# Patient Record
Sex: Male | Born: 1980 | Race: White | Hispanic: No | Marital: Married | Smoking: Never smoker
Health system: Southern US, Community
[De-identification: ages and names within clinical notes are randomized; demographics above are authoritative.]

## PROBLEM LIST (undated history)

## (undated) DIAGNOSIS — G4733 Obstructive sleep apnea (adult) (pediatric): Secondary | ICD-10-CM

## (undated) DIAGNOSIS — F319 Bipolar disorder, unspecified: Secondary | ICD-10-CM

## (undated) DIAGNOSIS — K8010 Calculus of gallbladder with chronic cholecystitis without obstruction: Secondary | ICD-10-CM

## (undated) DIAGNOSIS — E785 Hyperlipidemia, unspecified: Secondary | ICD-10-CM

## (undated) DIAGNOSIS — E039 Hypothyroidism, unspecified: Secondary | ICD-10-CM

## (undated) DIAGNOSIS — F4310 Post-traumatic stress disorder, unspecified: Secondary | ICD-10-CM

## (undated) HISTORY — PX: OTHER SURGICAL HISTORY: SHX169

## (undated) HISTORY — PX: TONSILLECTOMY: SUR1361

---

## 2006-07-31 ENCOUNTER — Emergency Department (HOSPITAL_COMMUNITY): Admission: EM | Admit: 2006-07-31 | Discharge: 2006-07-31 | Payer: Self-pay | Admitting: Emergency Medicine

## 2007-12-28 ENCOUNTER — Inpatient Hospital Stay (HOSPITAL_COMMUNITY): Admission: RE | Admit: 2007-12-28 | Discharge: 2007-12-30 | Payer: Self-pay | Admitting: Orthopedic Surgery

## 2008-06-01 ENCOUNTER — Ambulatory Visit (HOSPITAL_BASED_OUTPATIENT_CLINIC_OR_DEPARTMENT_OTHER): Admission: RE | Admit: 2008-06-01 | Discharge: 2008-06-01 | Payer: Self-pay | Admitting: Orthopedic Surgery

## 2008-06-29 ENCOUNTER — Emergency Department (HOSPITAL_COMMUNITY): Admission: EM | Admit: 2008-06-29 | Discharge: 2008-06-29 | Payer: Self-pay | Admitting: Emergency Medicine

## 2008-07-02 ENCOUNTER — Encounter: Admission: RE | Admit: 2008-07-02 | Discharge: 2008-07-02 | Payer: Self-pay | Admitting: Orthopedic Surgery

## 2010-05-22 LAB — POCT HEMOGLOBIN-HEMACUE: Hemoglobin: 15.1 g/dL (ref 13.0–17.0)

## 2010-06-25 NOTE — Op Note (Signed)
NAMEJACHOB, [name] NO.:  [id number]   MEDICAL RECORD NO.:  [id number]          PATIENT TYPE:  AMB   LOCATION:  DSC                          FACILITY:  [facility]   PHYSICIAN:  Doralee Albino. Carola Frost, M.D. DATE OF BIRTH:  13-May-1980   DATE OF PROCEDURE:  DATE OF DISCHARGE:                               OPERATIVE REPORT   PREOPERATIVE DIAGNOSES:  1. Right ankle post-traumatic arthritis.  2. Right ankle flexion contracture.   POSTOPERATIVE DIAGNOSES:  1. Right ankle post-traumatic arthritis.  2. Right ankle flexion contracture.   PROCEDURES:  1. Right ankle arthroscopic debridement, extensive.  2. Achilles tendon lengthening.   SURGEON:  Doralee Albino. Carola Frost, MD   ASSISTANT:  Leonides Grills, MD  Mearl Latin, PA   ANESTHESIA:  General.   COMPLICATIONS:  None.   TOURNIQUET:  None.   ESTIMATED BLOOD LOSS:  Minimal.   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   DISPOSITION:  To PACU.   CONDITION:  Stable.   BRIEF SUMMARY AND INDICATIONS FOR PROCEDURE:  Paul Simmons is a 30-year-  old male who is status post pilon fracture which was treated initially  with external fixation by another surgeon.  He required revision ORIF  because of persistent subluxation and amount of reduction.  Following  osteotomy and repair, the patient initially did well but then began to  complain of persistent and progressive arthritic-type symptoms involving  the ankle joint.  These were relieved with Marcaine steroid injection.  I have discussed with him and his mother preoperatively the risks and  benefits of surgery including the possibility of infection, nerve  injury, vessel injury, failure to resolve his mechanical symptoms,  progression of arthritis, need for further surgery, and multiple others.  After full discussion, he wished to proceed.   BRIEF DESCRIPTION OF PROCEDURE:  Paul Simmons was taken to the  operating  room where general anesthesia was induced.  His right lower extremity  was prepped and draped in usual sterile fashion.  Standard anterior,  medial, and lateral portals were established.  The ankle joint was  initially insufflated with saline.  A spread technique was used to  reduce the risk of injury to the superficial peroneal nerve and tertius.  Arthroscopic evaluation revealed extensive scar in the medial gutter as  well as the lateral gutter and syndesmotic space.  It was in this area  laterally where again roughly centimeter of full-thickness cartilage  loss was identified.  With the assistance of Dr. Lestine Box, curette and  shaver were used to debride the edges, and shave the articular  cartilage.  All the scar tissue was removed with the shaver as well as  the arthroscopic wand.  This scar tissue was again very well developed  and completely entered the joint and prevented any articular contact in  large areas.  Montez Morita assisted with manipulation of the ankle joint  and the associated Achilles tendon lengthening and was necessary to the  procedure.  Three incisions were made, two on one side and one of  the  other side of midline and then in this fashion after percutaneous  release the ankle was manipulated allowing the Achilles tendon to  lengthen.  After the lengthening, all wounds were irrigated and then  closed in standard fashion with 4-0 and 3-0 nylon.  Sterile gently  compressive dressing was applied with the patient's ankle in maximal  extension and then a posterior stirrup splint.  The patient was awakened  from anesthesia and transported to the PACU in stable condition.  We did  inject 20 mL of 0.5% Marcaine with epi.  Also, we were able to extend  the ankle to nearly 25 degrees following the Achilles release.   PROGNOSIS:  Paul Simmons will be non-weightbearing for the first week.  We will see him back in 7-10 days, at which time we will remove his  sutures  and initiate weightbearing and range of motion.      Doralee Albino. Carola Frost, M.D.  Electronically Signed     MHH/MEDQ  D:  06/01/2008  T:  06/02/2008  Job:  [id number]

## 2010-06-25 NOTE — Op Note (Signed)
Paul Simmons, Paul Simmons NO.:  [id number]   MEDICAL RECORD NO.:  [id number]          PATIENT TYPE:  INP   LOCATION:  5022                         FACILITY:  [facility]   PHYSICIAN:  Doralee Albino. Carola Frost, M.D. DATE OF BIRTH:  [birth year]   DATE OF PROCEDURE:  12/28/2007  DATE OF DISCHARGE:                               OPERATIVE REPORT   PREOPERATIVE DIAGNOSES:  1. Malunion, right tibia.  2. Malunion, fibula.  3. Subluxated tibiotalar joint.  4. Disrupted chronically mild reduced syndesmosis.   POSTOPERATIVE DIAGNOSES:  1. Malunion, right tibia.  2. Malunion, fibula.  3. Subluxated tibiotalar joint.  4. Disrupted chronically unreduced syndesmosis.   PROCEDURES:  1. Osteotomy, tibia.  2. Osteotomy, fibula.  3. Open reduction of chronic ankle subluxation.  4. Removal of deep implant.  5. Repair of right ankle syndesmosis.   SURGEON:  Myrene Galas, MD   ASSISTANT:  None.   ANESTHESIA:  General.   COMPLICATIONS:  None.   SPECIMENS:  None.   TOURNIQUET:  None.   ESTIMATED BLOOD LOSS:  300 mL.   DISPOSITION:  To PACU.   CONDITION:  Stable.   BRIEF SUMMARY AND INDICATION FOR PROCEDURE:  Paul Simmons is a 30-year-  old male status post a fracture dislocation with a pilon-type pattern  and severe fracture blisters about 1 year ago.  He was treated with  external fixation and percutaneous screw fixation.  He went on to unite,  but has never regained ability to ambulate without use of a cane, cannot  extend the ankle and has to walk with the tibiotalar joint in an  externally rotated position.  Plain film of the CT scan demonstrates  malunion of the tibia and fibula with anterior gapping of the  syndesmosis, external rotation of the foot, malunion of the medial  malleolus as well, and clear up posterior subluxation of the talus with  respect to the tibia.  I discussed with him preoperatively risks and  benefits of surgery, which were numerous.  These included  inability to  reduce the ankle, failure of repair, nonunion, infection, neurovascular  injury, need further surgery, and multiple others.  After full  discussion, he wished to proceed.   DESCRIPTION OF PROCEDURE:  Paul Simmons was given 2 g of Ancef  preoperatively and taken to the operating room where general anesthesia  was induced.  He was positioned with the right side up laterally.  A  long extensile exposure was then made over the fibula and dissection  carried out anterior-posterior over the fibula, as such I was able to  identify the old fracture site and then used straight and curved  osteotome to perform an osteotomy of the fibula.  We continued along the  anterior syndesmosis, which again was gapped open anteriorly, release  some of the fibrous tissue in this area and then we were able to  externally rotate the fibula, such that we could visualize the joint.  We could clearly discern the articular step off and the posterior  subluxation of the talus.  A straight osteotome was used to osteotomize  the malunited  posterior malleolar fragment.  The joint space and  articular cartilage appeared fairly well preserved, which was not  entirely expected and it should be helpful prognostically.  Then, I went  posteriorly along the malleolar segment releasing it under direct  visualization and also releasing the posterior capsule, which was  contracted.  Then, using K-wires, the joystick and curved curette were  attempted to mobilize the posterior malleolar fragment.  This was  exceedingly difficult and we were not successful.  Consequently, we then  obtained a femoral distractor, we placed a pin in the proximal plateau  as well as widening the calcaneus and we were able to produce  distraction across the joint that way maximally and then ultimately to  translate the talus forward and back into a congruent position within  the joint.  This was pinned provisionally with a large K-wire.   Attention was then turned again to the posterior malleolar fragment off  the tibia and with considerable effort and use of additional OR  assistant, we were able to bring it down into a an anatomically reduced  position as gauge by fluoroscopy on a direct visualization.  This  required [place] to tilt the fragment somewhat as well, so we would have the  appropriate congruity.  This felt that the gap of the apex of the  osteotomy site.  It was pinned provisionally along the subchondral level  and then a anterior-posterior screw placed to key in this reduction.  We  irrigated thoroughly and I did excise some of the scar tissue within the  joint.  Then, placed a antiglide plate with 2.5 of one-third tubular  plate with 2 screws above the fracture and 1 lag screw in the posterior  malleolus.  Attention was then turned to the fibula osteotomy where we  obtained length and corrected some of the malrotation.  Before securing  the fibula, I then placed a Darrick Penna clamp to reduce the syndesmosis.  This did require few different placements and maneuvers in order to get  this anatomically reduced, but ultimately we were not successful in  doing so.  At that point, we then secured fixation to the rest of the  fibula extending the lateral incision even more proximally and placing a  3.5 Recon plate as a posterior buttress to push the distal fibula  anteriorly back into the reduced position.  We did place 1 lag screw at  the osteotomy site as well and then placed multiple screws in the  proximal and distal fragments.  A single syndesmotic screw was placed  just above the level of subchondral bone.  It should be noted that prior  to placement of plate, we did extensively graft the tibial osteotomy and  that we also grafted the fibular osteotomy as well.  We did also have to  find and remove the screw that had already been placed in cannulated  fashion from anterior to posterior prior to making the osteotomy  of the  tibia.  This screw was extensively bent, but nonetheless we were able to  withdrawal it.  With an anterior screw, we did extend the incision both  distally and proximally in order to extract the screw safely in this  somewhat high risk area.  The wounds were all copiously irrigated and  then closed in standard-layered fashion using 0 Vicryl, 2-0 Vicryl, and  staples for the skin.  The pin was left in the calcaneus, talus, and  tibia, but the femoral distractor was removed.  The patient was then  awakened from anesthesia and transported to PACU in stable condition  after application of a posterior stirrup splint.   PROGNOSIS:  Paul Simmons has had an unbelievable injury to this ankle and  has had persistent subluxation amount reduction, which has precluded,  returned to ambulatory function.  As a result, he also has some  contracture of his posterior structures and it is not at all clear that  were through all of his procedures as he may yet require lengthening of  his Achilles and further posterior release, but we are hopeful that  reduction and restoration of anatomy would  translate into functional recovery as well, particularly given the  capsular release, which was quite extensive.  He will be non-  weightbearing for the next 6-8 weeks with graduated weightbearing  thereafter.  He will be on Lovenox for DVT prophylaxis.      Doralee Albino. Carola Frost, M.D.  Electronically Signed     MHH/MEDQ  D:  12/28/2007  T:  12/29/2007  Job:  [id number]

## 2010-06-25 NOTE — Discharge Summary (Signed)
Paul Simmons, Paul Simmons NO.:  [id number]   MEDICAL RECORD NO.:  [id number]          PATIENT TYPE:  INP   LOCATION:  5022                         FACILITY:  [facility]   PHYSICIAN:  Doralee Albino. Carola Frost, M.D. DATE OF BIRTH:  05/20/1980   DATE OF ADMISSION:  12/28/2007  DATE OF DISCHARGE:  12/30/2007                               DISCHARGE SUMMARY   DISCHARGE DIAGNOSES:  1. Right distal tibial malunion.  2. Right fibular malunion.  3. Subluxated tibiotalar joint of right ankle.  4. Disrupted, chronically unreduced right syndesmosis.   ADDITIONAL DISCHARGE DIAGNOSIS:  Bipolar disorder.   PROCEDURES PERFORMED:  On December 28, 2007, right tibial osteotomy;  right fibular osteotomy; open reduction of chronic right ankle  subluxation; removal of deep implant, right ankle and repair of right  ankle syndesmosis.   BRIEF HISTORY AND HOSPITAL COURSE:  Paul Simmons is a very pleasant 31-  year-old Caucasian male who initially presented to our office status  post fracture-dislocation with pilon-type fracture approximately 1 year  ago.  He was initially treated with external fixation and percutaneous  screw fixation.  He eventually went on to unite, but has never regained  the ability to ambulate without the use of cane and was unable to fully  extend ankle and has to walk with his ankle in an externally rotated  position.  After much discussion, Paul Simmons elected to proceed with the  surgical fixation of his injury and was brought to the operating room on  December 28, 2007.  Paul Simmons tolerated the procedure very well and after  surgery, he was brought to the postanesthesia care unit for recovery  from anesthesia and then was transferred to the orthopedic floor for  continued observation and pain control.   Paul Simmons's hospital stay was 2 days in length and was uncomplicated.  At no  point in time, did he have any acute issues other than the expected with  pain management.  After 1 day of  PCA, Paul Simmons was able to be converted to  p.o. pain medications successfully and without significant issues.  On  postoperative day #2, Paul Simmons was deemed to be stable for discharge to  home from the hospital.  Clinical encounter note for postoperative day  #2, subjective/objective, the patient is doing well.  The pain is better  controlled with p.o. medications.  He has had an increase in appetite.  No new issues.  He is voiding well but not having a bowel movement.  He  did have a bowel movement prior to admission for surgery.  No complaints  of chest pain.  No shortness of breath.  No abdominal pain.  No nausea,  vomiting, or diarrhea.  He did not complain of any paresthesias.   PHYSICAL EXAMINATION:  VITAL SIGNS:  Temperature 98.1, heart rate 99, BP  127/79, respirations 20 at 97% on room air.  He has great diuresis.  GENERAL:  Paul Simmons is comfortable, in no acute distress.  LUNGS:  Clear to auscultation bilaterally.  HEART:  Regular rate and rhythm.  ABDOMEN:  Soft, nontender.  Positive bowel sounds.  EXTREMITIES:  Right  lower extremity, splint is clean, dry, and intact.  Paul Simmons has good flexion and extension of his great and lesser toes.  Deep  peroneal nerve, superficial peroneal nerve, and tibial nerve sensation  is intact grossly on the right lower extremity.  Brisk capillary refill  is noted.  Digits are warm.   PERTINENT LABS:  Postoperative CBC:  White blood cell 7.6, hemoglobin  13.3, hematocrit 37.7, platelets 176.  Sodium 141, potassium 4.0,  chloride 103, bicarb 34, glucose 116, BUN 4, creatinine 1.16.   ASSESSMENT AND PLAN:  A 30 year old status post right tib-fib osteotomy  with open reduction of chronic ankle dislocation subluxation.  1. Non-weightbearing, right lower extremity.  2. The patient will walk with assistance.  3. Aspirin for deep vein thrombosis prophylaxis as we were unable to      ascertain entrance into Lovenox Assistance Program.  4. Paul Simmons will follow up in  1 week.  5. We will discharge home today.  6. The patient will work with PT today before discharge.  7. Paul Simmons should keep his splint on until followup visit and should      avoid getting it wet or dirty.   DISCHARGE MEDICATIONS:  1. Percocet 10/325 one to two p.o. q.4 to 6 h. as needed for pain, 60.  2. Oxycodone 5 mg 1 to 2 p.o. every 3 hours for breakthrough pain      between Percocet doses, 30.  3. Robaxin 500 mg 1 to 2 every 6 hours as needed for spasm, 60.  4. Aspirin 325 mg p.o. daily and the patient gets this over the      counter.   The patient may resume his home medications as follows:  1. Depakote 1500 mg extended release p.o. at bedtime.  2. Lamictal 200 mg p.o. at bedtime.  3. Zantac 150 mg p.o. p.r.n.   DISCHARGE INSTRUCTIONS:  Paul Simmons has sustained a significant injury to his  right ankle and given his prolonged condition prior to this surgical  fixation, he has developed some contracture of his posterior structures  that may make ambulation fairly difficult when he has recovered from a  bone healing perspective.  This may require future procedures, which may  include Achilles tendon lengthening or further posterior release, but  hopefully with the anatomic reduction that was achieved, he will be able  to function well.  Paul Simmons will be non-weightbearing for the next 6 to 8  weeks with graduated weightbearing thereafter.  Paul Simmons was on Lovenox  while in the hospital and had received 2 doses.  Unfortunately, we were  unable to enroll Paul Simmons into the [employer], and we will  carry out further anticoagulation with aspirin 325 mg p.o. daily and may  continue this for several weeks.  I have instructed Paul Simmons that another  method to reduce his risk for DVT would be to mobilize.  I have  instructed Paul Simmons that he may ambulate with his walker while maintaining  his non-weightbearing restrictions.  He should also perform ankle pumps  on his well leg and can move his  digits and knees on his operative side  to help with blood flow.  Paul Simmons will follow up in approximately 1 week  at which time, we will remove his splint and inspect his surgical wounds  to make sure they are healing appropriately and would anticipate  placement back into either a splint or a night splint.  Paul Simmons should  also maintain a good splint care and should avoid getting  his splint wet  or dirty.  His splint is made out of plaster and should it get wet, it  will break down and provide no stability whatsoever.  We reviewed all  this  information with Paul Simmons in detail and he understands and acknowledges  what he needs to do and instructions he needs to follow.  Should Paul Simmons  have any questions prior to his followup visit, he is encouraged to  contact the office at [phone].      Mearl Latin, PA      Doralee Albino. Carola Frost, M.D.  Electronically Signed    KWP/MEDQ  D:  12/30/2007  T:  12/31/2007  Job:  [id number]

## 2010-11-12 LAB — CBC
HCT: 37.7 — ABNORMAL LOW
MCHC: 34.8
MCHC: 35.3
MCV: 89.6
WBC: 7.6

## 2010-11-12 LAB — BASIC METABOLIC PANEL
CO2: 34 — ABNORMAL HIGH
Chloride: 103
GFR calc Af Amer: >60
GFR calc non Af Amer: >60
Sodium: 141

## 2010-11-27 LAB — BASIC METABOLIC PANEL
Calcium: 8.7
Creatinine, Ser: 1.41
GFR calc Af Amer: >60
GFR calc non Af Amer: >60
Glucose, Bld: 90
Potassium: 4.1
Sodium: 138

## 2010-11-27 LAB — CBC
HCT: 43.8
MCHC: 36.6 — ABNORMAL HIGH
MCV: 90
RBC: 4.87
WBC: 6.1

## 2010-11-27 LAB — DIFFERENTIAL
Basophils Absolute: 0
Eosinophils Absolute: 0.1
Lymphocytes Relative: 21
Monocytes Absolute: 0.7
Monocytes Relative: 11
Neutro Abs: 4

## 2011-06-12 DIAGNOSIS — F3175 Bipolar disorder, in partial remission, most recent episode depressed: Secondary | ICD-10-CM | POA: Diagnosis not present

## 2011-06-12 DIAGNOSIS — IMO0002 Reserved for concepts with insufficient information to code with codable children: Secondary | ICD-10-CM | POA: Diagnosis not present

## 2011-06-12 DIAGNOSIS — Z79899 Other long term (current) drug therapy: Secondary | ICD-10-CM | POA: Diagnosis not present

## 2011-06-18 DIAGNOSIS — F3175 Bipolar disorder, in partial remission, most recent episode depressed: Secondary | ICD-10-CM | POA: Diagnosis not present

## 2011-06-18 DIAGNOSIS — IMO0002 Reserved for concepts with insufficient information to code with codable children: Secondary | ICD-10-CM | POA: Diagnosis not present

## 2011-07-02 DIAGNOSIS — T2640XA Burn of unspecified eye and adnexa, part unspecified, initial encounter: Secondary | ICD-10-CM | POA: Diagnosis not present

## 2011-07-02 DIAGNOSIS — Z79899 Other long term (current) drug therapy: Secondary | ICD-10-CM | POA: Diagnosis not present

## 2011-07-02 DIAGNOSIS — T1590XA Foreign body on external eye, part unspecified, unspecified eye, initial encounter: Secondary | ICD-10-CM | POA: Diagnosis not present

## 2011-07-02 DIAGNOSIS — T310 Burns involving less than 10% of body surface: Secondary | ICD-10-CM | POA: Diagnosis not present

## 2011-08-25 DIAGNOSIS — L259 Unspecified contact dermatitis, unspecified cause: Secondary | ICD-10-CM | POA: Diagnosis not present

## 2012-01-01 DIAGNOSIS — F3175 Bipolar disorder, in partial remission, most recent episode depressed: Secondary | ICD-10-CM | POA: Diagnosis not present

## 2012-01-01 DIAGNOSIS — IMO0002 Reserved for concepts with insufficient information to code with codable children: Secondary | ICD-10-CM | POA: Diagnosis not present

## 2012-01-14 DIAGNOSIS — M25569 Pain in unspecified knee: Secondary | ICD-10-CM | POA: Diagnosis not present

## 2012-01-20 ENCOUNTER — Ambulatory Visit (HOSPITAL_COMMUNITY)
Admission: RE | Admit: 2012-01-20 | Discharge: 2012-01-20 | Disposition: A | Discharge status: Home / Self Care | Payer: Medicare Other | Source: Ambulatory Visit | Attending: Orthopedic Surgery | Admitting: Orthopedic Surgery

## 2012-01-20 DIAGNOSIS — M25569 Pain in unspecified knee: Secondary | ICD-10-CM | POA: Insufficient documentation

## 2012-01-20 DIAGNOSIS — R29898 Other symptoms and signs involving the musculoskeletal system: Secondary | ICD-10-CM | POA: Insufficient documentation

## 2012-01-20 DIAGNOSIS — IMO0001 Reserved for inherently not codable concepts without codable children: Secondary | ICD-10-CM | POA: Insufficient documentation

## 2012-01-20 DIAGNOSIS — M6281 Muscle weakness (generalized): Secondary | ICD-10-CM | POA: Insufficient documentation

## 2012-01-20 NOTE — Evaluation (Signed)
Physical Therapy Evaluation  Patient Details  Name: Paul Simmons MRN: [id number] Date of Birth: 1980/11/19  Today's Date: 01/20/2012 Time: 1440-1520 PT Time Calculation (min): 40 min Charges: 1 eval, 8' TE Visit#: 1  of 6   Re-eval: 02/10/12 Assessment Diagnosis: R MCL  Next MD Visit: Dr. Carola Frost - 3 weeks  Authorization: MEDICARE  Authorization Time Period:    Authorization Visit#: 1  of 10    Subjective Symptoms/Limitations Pertinent History: Pt is referred to PT secondary to R knee pain after a fall when he was walking on 11/05/11. He has a history of R ankle ORIF from a trimalleolar injury and causes him to fall. He reports most of his pain is worst when he is sitting down and with palpation.  He enjoys walking his dog about 1/2 mile and after the walk it typically is sore afterwards.  He explains he has tried to walk on the track, however continues to have significant pain to his L hip due to gait abnormalities. PMH: PTSD How long can you sit comfortably?: Reports most of his pain is when he sits down.  How long can you walk comfortably?: 1/2 mile with dog. 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  Comments: currently on disability for R ankle ORIF, attending school to learn computer science. Takes care of his sister's dog.   Sensation/Coordination/Flexibility/Functional Tests Functional Tests Functional Tests: LEFS: 42/80  Assessment RLE Strength Right Hip Flexion: 4/5 Right Hip Extension: 3+/5 Right Hip ABduction: 4/5 Right Hip ADduction: 4/5 Right Knee Flexion: 4/5 Right Knee Extension: 4/5 Palpation Palpation: increased  pain and tenderness to MCL region and anterior medial patella  Mobility/Balance  Ambulation/Gait Ambulation/Gait: Yes Static Standing Balance Static Standing - Comment/# of Minutes: decreased ankle and hip strategy Single Leg Stance - Right Leg: 5  Single Leg Stance - Left Leg: 10    Exercise/Treatments Supine Bridges: Both;10 reps Straight Leg Raises: Both;10 reps Patellar Mobs: demonstration and education on joint mobs  Physical Therapy Assessment and Plan PT Assessment and Plan Clinical Impression Statement: Pt is a 31 year old male referred to PT for R knee pain/MCL sprain which occured in Septemeber after a fall from impaired balance due to PMH of R ankle ORIF. Pt will benefit from skilled therapeutic intervention in order to improve on the following deficits: Abnormal gait;Decreased balance;Pain;Decreased strength;Decreased activity tolerance Rehab Potential: Good PT Frequency: Min 2X/week PT Duration: Other (comment) (3 weeks) PT Treatment/Interventions: Stair training;Functional mobility training;Therapeutic activities;Therapeutic exercise;Balance training;Neuromuscular re-education;Patient/family education;Manual techniques;Modalities PT Plan: squats, heel and toe raises, knee flexion, TKE, rocker board, 4 way SLR. Add [place] for R knee pain.    Goals Home Exercise Program Pt will Perform Home Exercise Program: Independently PT Goal: Perform Home Exercise Program - Progress: Goal set today PT Short Term Goals Time to Complete Short Term Goals: 3 weeks PT Short Term Goal 1: Pt will report pain less than 3/10 for 75% of his day for improved QOL.  PT Short Term Goal 2: Pt will present with decreased fascial restrictions to R knee.  PT Short Term Goal 3: Pt will improve static standing balance on solid surface with R LE x30 sec.  PT Short Term  Goal 4: Pt will improve his LEFS to 42/80 for improved percieved functional ability.  PT Short Term Goal 5: Pt will improve LE strength by  1 muscle grade in order to ambulate greater than 1 hour in order to return to exercise activities.   Problem List Patient Active Problem List  Diagnosis  . Knee pain  . Leg weakness   PT Plan of Care PT Home Exercise Plan: see scanned report Consulted and Agree with Plan of Care: Patient  GP Functional Limitation: Mobility: Walking and moving around Mobility: Walking and Moving Around Current Status ([id number]): At least 40 percent but less than 60 percent impaired, limited or restricted Mobility: Walking and Moving Around Goal Status [phone]): At least 1 percent but less than 20 percent impaired, limited or restricted  [name], PT 01/20/2012, 4:46 PM  Physician Documentation Your signature is required to indicate approval of the treatment plan as stated above.  Please sign and either send electronically or make a copy of this report for your files and return this physician signed original.   Please mark one 1.__approve of plan  2. ___approve of plan with the following conditions.   ______________________________                                                          _____________________ Physician Signature                                                                                                             Date

## 2012-01-22 ENCOUNTER — Ambulatory Visit (HOSPITAL_COMMUNITY)
Admission: RE | Admit: 2012-01-22 | Discharge: 2012-01-22 | Disposition: A | Discharge status: Home / Self Care | Payer: Medicare Other | Source: Ambulatory Visit | Attending: Orthopedic Surgery | Admitting: Orthopedic Surgery

## 2012-01-22 DIAGNOSIS — IMO0001 Reserved for inherently not codable concepts without codable children: Secondary | ICD-10-CM | POA: Diagnosis not present

## 2012-01-22 DIAGNOSIS — M25569 Pain in unspecified knee: Secondary | ICD-10-CM | POA: Diagnosis not present

## 2012-01-22 DIAGNOSIS — M6281 Muscle weakness (generalized): Secondary | ICD-10-CM | POA: Diagnosis not present

## 2012-01-22 NOTE — Progress Notes (Signed)
Physical Therapy Treatment Patient Details  Name: Paul Simmons MRN: [id number] Date of Birth: [birth year]  Today's Date: 01/22/2012 Time: 1516-1600 PT Time Calculation (min): 44 min  Visit#: 2  of 6   Re-eval: 02/10/12  Charge: Therex 34', [place] 8'  Authorization: MEDICARE  Authorization Time Period:    Authorization Visit#: 2  of 10    Subjective: Symptoms/Limitations Symptoms: Pt stated most R knee pain when sitting, pain scale 3/10 currently.  Pt stated compliance with HEP. Pain Assessment Currently in Pain?: Yes Pain Score:   3 Pain Location: Knee Pain Orientation: Right  Objective:  Exercise/Treatments Stretches Active Hamstring Stretch: 3 reps;30 seconds Quad Stretch: 3 reps;30 seconds Standing Heel Raises: 15 reps;Limitations Heel Raises Limitations: toe raises 15 reps Knee Flexion: Right;15 reps Terminal Knee Extension: Right;15 reps;Theraband;Limitations Theraband Level (Terminal Knee Extension): Level 4 (Blue) Terminal Knee Extension Limitations: 5" holds Functional Squat: 15 reps;Limitations Functional Squat Limitations: cueing for form Rocker Board: 2 minutes;Limitations Rocker Board Limitations: R/L Supine Bridges: 15 reps Straight Leg Raises: 15 reps Patellar Mobs: S/I, R/L, tib/fib mobs Sidelying Hip ABduction: 15 reps Hip ADduction: 15 reps Prone  Hip Extension: 15 reps   Modalities Modalities: Ultrasound Ultrasound Ultrasound Location: R medial knee  Ultrasound Parameters: 1.0 w/cm2 3 MHz continuous Ultrasound Goals: Pain  Physical Therapy Assessment and Plan PT Assessment and Plan Clinical Impression Statement: Began PT treatment for R knee strengthening and pain relief.  Pt able to demonstrate appropriate form with all exercises without difficulty following cues for technique with no reports of increased  pain with therex.  Min tactile cueing with functional squats for proper form to reduce stress on knees.  Ended session with [place] to  medial knee for pain relief. PT Plan: F/u with [place] assessing pain relief.  Continue with current POC for R knee strengthening and pain relief.     Goals    Problem List Patient Active Problem List  Diagnosis  . Knee pain  . Leg weakness    PT - End of Session Activity Tolerance: Patient tolerated treatment well General Behavior During Session: [facility] for tasks performed Cognition: [facility] for tasks performed  GP    Juel Burrow 01/22/2012, 4:30 PM

## 2012-01-27 ENCOUNTER — Ambulatory Visit (HOSPITAL_COMMUNITY)
Admission: RE | Admit: 2012-01-27 | Discharge: 2012-01-27 | Disposition: A | Discharge status: Home / Self Care | Payer: Medicare Other | Source: Ambulatory Visit | Attending: Orthopedic Surgery | Admitting: Orthopedic Surgery

## 2012-01-27 DIAGNOSIS — M6281 Muscle weakness (generalized): Secondary | ICD-10-CM | POA: Diagnosis not present

## 2012-01-27 DIAGNOSIS — IMO0001 Reserved for inherently not codable concepts without codable children: Secondary | ICD-10-CM | POA: Diagnosis not present

## 2012-01-27 DIAGNOSIS — M25569 Pain in unspecified knee: Secondary | ICD-10-CM | POA: Diagnosis not present

## 2012-01-27 NOTE — Progress Notes (Signed)
Physical Therapy Treatment Patient Details  Name: Paul Simmons MRN: [id number] Date of Birth: June 02, 1980  Today's Date: 01/27/2012 Time: 7829-5621 PT Time Calculation (min): 47 min  Visit#: 3  of 8   Re-eval: 02/10/12  Charge: [place] 8', therex 38'  Authorization: Medicare  Authorization Time Period:    Authorization Visit#: 3  of 10    Subjective: Symptoms/Limitations Symptoms: Pt stated R knee tight following [place] but pain was reduced with modality.  Pt reported he has been completeing increased activities with increased pain scale 6/10 today. Pain Assessment Currently in Pain?: Yes Pain Score:   6 Pain Location: Knee Pain Orientation: Right  Objective:   Exercise/Treatments Stretches Active Hamstring Stretch: 3 reps;30 seconds Quad Stretch: 3 reps;30 seconds Machines for Strengthening Cybex Knee Extension: 2PL 15 reps R LE only Cybex Knee Flexion: 2.5 PL 15 R LE only Standing Heel Raises: 15 reps;Limitations Heel Raises Limitations: toe raises 15 reps Knee Flexion: Right;15 reps Terminal Knee Extension: Right;15 reps;Theraband;Limitations Theraband Level (Terminal Knee Extension): Level 4 (Blue) Terminal Knee Extension Limitations: 5" holds Wall Squat: 15 reps;5 seconds Rocker Board: 2 minutes;Limitations Rocker Board Limitations: R/L no HHA; A/P with 1 HHA Supine Short Arc Quad Sets: Right;15 reps Bridges: 15 reps Straight Leg Raises: 15 reps Patellar Mobs: S/I, R/L, tib/fib mobs Sidelying Hip ABduction: 15 reps Hip ADduction: 15 reps Prone  Hip Extension: 15 reps   Modalities Modalities: Ultrasound Ultrasound Ultrasound Location: R medial knee Ultrasound Parameters: 1.0 w/cm2 3 MHz continuous Ultrasound Goals: Pain  Physical Therapy Assessment and Plan PT Assessment and Plan Clinical Impression Statement: Progressed functional squats to wall squats for overall LE strengthening as well as to reduce stress on R ankle, pt able to demonstrate  approriately with min cueing for form with no c/o pain.  Able to add cybex quad and hamstrings to POC for strengthening, pt able to demonstrate with good control and no c/o increased pain but visible quad fatigue from 15 reps.  Stretches complete following [place] this session with good results, no c/o tightness at end of session and pain was reduced.   PT Plan: F/u with [place] this session.  Continue with current POC for R knee strengthening and pain relief.  Begin stair training next session.      Goals    Problem List Patient Active Problem List  Diagnosis  . Knee pain  . Leg weakness    PT - End of Session Activity Tolerance: Patient tolerated treatment well General Behavior During Session: [facility] for tasks performed Cognition: [facility] for tasks performed  GP    Juel Burrow 01/27/2012, 6:13 PM

## 2012-01-29 ENCOUNTER — Ambulatory Visit (HOSPITAL_COMMUNITY)
Admission: RE | Admit: 2012-01-29 | Discharge: 2012-01-29 | Disposition: A | Discharge status: Home / Self Care | Payer: Medicare Other | Source: Ambulatory Visit | Attending: Orthopedic Surgery | Admitting: Orthopedic Surgery

## 2012-01-29 DIAGNOSIS — M25569 Pain in unspecified knee: Secondary | ICD-10-CM | POA: Diagnosis not present

## 2012-01-29 DIAGNOSIS — M6281 Muscle weakness (generalized): Secondary | ICD-10-CM | POA: Diagnosis not present

## 2012-01-29 DIAGNOSIS — IMO0001 Reserved for inherently not codable concepts without codable children: Secondary | ICD-10-CM | POA: Diagnosis not present

## 2012-01-29 NOTE — Progress Notes (Signed)
Physical Therapy Treatment Patient Details  Name: Paul Simmons MRN: [id number] Date of Birth: 04/29/1980  Today's Date: 01/29/2012 Time: 4098-1191 PT Time Calculation (min): 62 min  Visit#: 4  of 8   Re-eval: 02/10/12  Charge: [place] 8', manual 8', therex 44  Authorization: Medicare  Authorization Time Period:    Authorization Visit#: 4  of 10    Subjective: Symptoms/Limitations Symptoms: Pt reported he did too many exercises yesterday, increased pain scale today 5/10. Pain Assessment Currently in Pain?: Yes Pain Score:   5 Pain Location: Knee Pain Orientation: Right;Medial  Objective:   Exercise/Treatments Stretches Active Hamstring Stretch: 3 reps;30 seconds Machines for Strengthening Cybex Knee Extension: 2PL 15 reps R LE only Cybex Knee Flexion: 3.0 PL 15 R LE only Standing Heel Raises: Limitations Heel Raises Limitations: heel/toe walking 2RT Terminal Knee Extension: Right;15 reps;Theraband;Limitations Theraband Level (Terminal Knee Extension): Level 4 (Blue) Terminal Knee Extension Limitations: 5" holds Lateral Step Up: 10 reps;Step Height: 6";Right Forward Step Up: 10 reps;Step Height: 6";Right Wall Squat: 15 reps;5 seconds Rocker Board: 2 minutes;Limitations Rocker Board Limitations: R/L no HHA; A/P with 1 HHA Supine Short Arc Quad Sets: Right;15 reps;Limitations Short Arc Quad Sets Limitations: 3# Bridges: Musician Limitations: HEP Straight Leg Raises: Limitations Straight Leg Raises Limitations: HEP Patellar Mobs: S/I, R/L, tib/fib mobs Sidelying Hip ABduction: Limitations Hip ABduction Limitations: HEP Hip ADduction: Limitations Hip ADduction Limitations: HEP Prone  Hip Extension: Limitations Hip Extension Limitations: HEP   Modalities Modalities: Ultrasound Manual Therapy Manual Therapy: Joint mobilization Joint Mobilization: A/P, S/I; TIb/fib joint mobs for patella tracking and pain reduction x 8 min  Ultrasound Ultrasound  Location: R medial knee Ultrasound Parameters: 1.0 w/cm2 3 MHz continuous  Ultrasound Goals: Pain  Physical Therapy Assessment and Plan PT Assessment and Plan Clinical Impression Statement: Began stair training for quad strengthening for functional strengthening, pt able to demonstrate appropriate technique following cues.  Pt was limited by pain in medial region R knee, stated US did help to reduce pain.   PT Plan: Continue with current POC for R knee strengthening and pain relief.      Goals    Problem List Patient Active Problem List  Diagnosis  . Knee pain  . Leg weakness    PT - End of Session Activity Tolerance: Patient tolerated treatment well;Patient limited by pain General Behavior During Session: [facility] for tasks performed Cognition: [facility] for tasks performed  GP    Juel Burrow 01/29/2012, 4:41 PM

## 2012-02-02 ENCOUNTER — Ambulatory Visit (HOSPITAL_COMMUNITY)
Admission: RE | Admit: 2012-02-02 | Discharge: 2012-02-02 | Disposition: A | Discharge status: Home / Self Care | Payer: Medicare Other | Source: Ambulatory Visit | Attending: Orthopedic Surgery | Admitting: Orthopedic Surgery

## 2012-02-02 DIAGNOSIS — M25569 Pain in unspecified knee: Secondary | ICD-10-CM | POA: Diagnosis not present

## 2012-02-02 DIAGNOSIS — M6281 Muscle weakness (generalized): Secondary | ICD-10-CM | POA: Diagnosis not present

## 2012-02-02 DIAGNOSIS — IMO0001 Reserved for inherently not codable concepts without codable children: Secondary | ICD-10-CM | POA: Diagnosis not present

## 2012-02-02 NOTE — Progress Notes (Signed)
Physical Therapy Treatment Patient Details  Name: Paul Simmons MRN: [id number] Date of Birth: Jun 20, 1980  Today's Date: 02/02/2012 Time: 9562-1308 PT Time Calculation (min): 52 min Visit#: 5  of 8   Re-eval: 02/10/12 Authorization: Medicare  Authorization Visit#: 5  of 10    Subjective: Symptoms/Limitations Symptoms: Pt. states he is having 5/10 pain today. Pain Assessment Currently in Pain?: Yes Pain Score:   5 Pain Location: Knee Pain Orientation: Right;Medial   Exercise/Treatments Machines for Strengthening Cybex Knee Extension: 2PL 2sets 15 reps R LE only Cybex Knee Flexion: 3.0 PL 2 sets 15 R LE only Standing Heel Raises: Limitations Heel Raises Limitations: toe walking 2RT Lateral Step Up: 10 reps;Step Height: 6";Right Forward Step Up: 10 reps;Step Height: 6";Right Step Down: 10 reps;Step Height: 4";Hand Hold: 0 Wall Squat: 10 reps;5 seconds Rocker Board: 2 minutes;Limitations Rocker Board Limitations: R/L no HHA; A/P with 1 HHA   Modalities Modalities: Ultrasound Manual Therapy Manual Therapy: Joint mobilization Joint Mobilization: A/P, S/I; TIb/fib joint mobs for patella tracking and pain reduction x 8 min  Ultrasound Ultrasound Location: R medial knee Ultrasound Parameters: 1.0 w/cm2 3 MHz continuous  Ultrasound Goals: Pain  Physical Therapy Assessment and Plan PT Assessment and Plan Clinical Impression Statement: Added forward step down and increased to 2 sets on cybex machines.  Pt. only c/o minimal pain with lateral step ups, but mostly through ankle not knee.  Pt. with continued pinpoint pain at medial knee. PT Plan: Continue with current POC for R knee strengthening and pain relief.  Add lunges next visit.     Problem List Patient Active Problem List  Diagnosis  . Knee pain  . Leg weakness    PT - End of Session Activity Tolerance: Patient tolerated treatment well;Patient limited by pain General Behavior During Session: [facility] for tasks  performed Cognition: [facility] for tasks performed   Lurena Nida, PTA/CLT 02/02/2012, 4:26 PM

## 2012-02-05 ENCOUNTER — Ambulatory Visit (HOSPITAL_COMMUNITY)
Admission: RE | Admit: 2012-02-05 | Discharge: 2012-02-05 | Disposition: A | Discharge status: Home / Self Care | Payer: Medicare Other | Source: Ambulatory Visit | Attending: Orthopedic Surgery | Admitting: Orthopedic Surgery

## 2012-02-05 DIAGNOSIS — M25569 Pain in unspecified knee: Secondary | ICD-10-CM | POA: Diagnosis not present

## 2012-02-05 DIAGNOSIS — IMO0001 Reserved for inherently not codable concepts without codable children: Secondary | ICD-10-CM | POA: Diagnosis not present

## 2012-02-05 DIAGNOSIS — M6281 Muscle weakness (generalized): Secondary | ICD-10-CM | POA: Diagnosis not present

## 2012-02-05 NOTE — Progress Notes (Signed)
Physical Therapy Treatment Patient Details  Name: Paul Simmons MRN: [id number] Date of Birth: 1980/09/29  Today's Date: 02/05/2012 Time: 1512-1555 PT Time Calculation (min): 43 min  Visit#: 6  of 8   Re-eval: 02/10/12  Charge: therex 27', manual 8', [place] 8'  Authorization: Medicare  Authorization Time Period:    Authorization Visit#: 6  of 10    Subjective: Symptoms/Limitations Symptoms: Pt stated he had good session last session, pain scale 3/10 R knee. Pain Assessment Currently in Pain?: Yes Pain Score:   3 Pain Location: Knee Pain Orientation: Right;Mid  Objective:   Exercise/Treatments Machines for Strengthening Cybex Knee Extension: 2.5 --> 2.0 PL 2sets 15 reps R LE only  Cybex Knee Flexion: 4.0 PL 2 sets 15 R LE only Standing Heel Raises: Limitations Heel Raises Limitations: heel/ toe walking 2RT Lateral Step Up: 15 reps;Hand Hold: 0;Step Height: 6" Forward Step Up: 15 reps;Hand Hold: 0;Step Height: 6" Step Down: 15 reps;Hand Hold: 0;Step Height: 4" Wall Squat: 10 reps;5 seconds Rocker Board: 2 minutes;Limitations Rocker Board Limitations: R/L no HHA; A/P with 1 HHA  Modalities Modalities: Ultrasound Manual Therapy Joint Mobilization: A/P, S/I; TIb/fib joint mobs for patella tracking and pain reduction x 8 min  Ultrasound Ultrasound Location: R medial knee Ultrasound Parameters: 1.0 w/cm2 3 MHz continuous  Ultrasound Goals: Pain  Physical Therapy Assessment and Plan PT Assessment and Plan Clinical Impression Statement: Pt continues to be limited by pain in medial region R knee and ankle.  Added lunges for stability and LE strengthening with min cueing for form, no c/o increased pain with new activity.  Able to increase weight on cybex machines with visible fatigue, pt stated pain increase with quad cybex machine so removed added weight no c/o with increase weight with hamstring machine.  Manual joint mobs and [place] complete at end of session for pain reduction,  pt stated pain 4-5/10 at end of session. PT Plan: Continue with curernt POC for R knee strengthening and pain relief.      Goals    Problem List Patient Active Problem List  Diagnosis  . Knee pain  . Leg weakness    PT - End of Session Activity Tolerance: Patient tolerated treatment well;Patient limited by pain General Behavior During Session: [facility] for tasks performed Cognition: [facility] for tasks performed  GP    Juel Burrow 02/05/2012, 4:02 PM

## 2012-02-08 DIAGNOSIS — R110 Nausea: Secondary | ICD-10-CM | POA: Diagnosis not present

## 2012-02-08 DIAGNOSIS — Z8249 Family history of ischemic heart disease and other diseases of the circulatory system: Secondary | ICD-10-CM | POA: Diagnosis not present

## 2012-02-08 DIAGNOSIS — Z79899 Other long term (current) drug therapy: Secondary | ICD-10-CM | POA: Diagnosis not present

## 2012-02-08 DIAGNOSIS — Z9889 Other specified postprocedural states: Secondary | ICD-10-CM | POA: Diagnosis not present

## 2012-02-08 DIAGNOSIS — R079 Chest pain, unspecified: Secondary | ICD-10-CM | POA: Diagnosis not present

## 2012-02-08 DIAGNOSIS — F319 Bipolar disorder, unspecified: Secondary | ICD-10-CM | POA: Diagnosis not present

## 2012-02-08 DIAGNOSIS — R0789 Other chest pain: Secondary | ICD-10-CM | POA: Diagnosis not present

## 2012-02-08 DIAGNOSIS — R0602 Shortness of breath: Secondary | ICD-10-CM | POA: Diagnosis not present

## 2012-02-09 ENCOUNTER — Ambulatory Visit (HOSPITAL_COMMUNITY): Payer: Medicare Other | Admitting: Physical Therapy

## 2012-02-09 DIAGNOSIS — R079 Chest pain, unspecified: Secondary | ICD-10-CM | POA: Diagnosis not present

## 2012-02-09 DIAGNOSIS — F319 Bipolar disorder, unspecified: Secondary | ICD-10-CM | POA: Diagnosis not present

## 2012-02-09 DIAGNOSIS — Z8249 Family history of ischemic heart disease and other diseases of the circulatory system: Secondary | ICD-10-CM | POA: Diagnosis not present

## 2012-02-09 DIAGNOSIS — R0602 Shortness of breath: Secondary | ICD-10-CM | POA: Diagnosis not present

## 2012-02-09 DIAGNOSIS — R0789 Other chest pain: Secondary | ICD-10-CM | POA: Diagnosis not present

## 2012-02-12 ENCOUNTER — Ambulatory Visit (HOSPITAL_COMMUNITY)
Admission: RE | Admit: 2012-02-12 | Discharge: 2012-02-12 | Disposition: A | Discharge status: Home / Self Care | Payer: Medicare Other | Source: Ambulatory Visit | Attending: Orthopedic Surgery | Admitting: Orthopedic Surgery

## 2012-02-12 DIAGNOSIS — M6281 Muscle weakness (generalized): Secondary | ICD-10-CM | POA: Insufficient documentation

## 2012-02-12 DIAGNOSIS — M25569 Pain in unspecified knee: Secondary | ICD-10-CM | POA: Insufficient documentation

## 2012-02-12 DIAGNOSIS — IMO0001 Reserved for inherently not codable concepts without codable children: Secondary | ICD-10-CM | POA: Diagnosis not present

## 2012-02-12 NOTE — Progress Notes (Signed)
Physical Therapy Re-evaluation/treatment/Discharge  Patient Details  Name: Paul Simmons MRN: [id number] Date of Birth: 19-Aug-1980  Today's Date: 02/12/2012 Time: 1515-1600 PT Time Calculation (min): 45 min  Visit#: 7  of 8   Re-eval:   Assessment Diagnosis: R MCL  Next MD Visit: Dr Carola Frost unscheduled Charge: MMT x 1, therex 30', self care 8'  Authorization: Medicare  Authorization Time Period:    Authorization Visit#: 7  of 10    Subjective Symptoms/Limitations Symptoms: R knee is feeling good.   How long can you sit comfortably?: unlmited  How long can you stand comfortably?: 1 hour How long can you walk comfortably?: 2 miles 20 min Pain Assessment Currently in Pain?: No/denies  Objective:   Sensation/Coordination/Flexibility/Functional Tests Functional Tests Functional Tests: LEFS: 64/80 (was LEFS 42/80)  Assessment RLE Strength Right Hip Flexion: 5/5 (was 4/5) Right Hip Extension:  (4+/5was 3+/5) Right Hip ABduction: 5/5 (was 4/5) Right Hip ADduction: 5/5 (was 4/5) Right Knee Flexion: 5/5 (was 4/5) Right Knee Extension:  (4+/5 was 4/5)  Exercise/Treatments Machines for Strengthening Cybex Knee Extension: 2.5PL 15 reps R LE only  Cybex Knee Flexion: 4.0 PL 15 R LE only Cybex Leg Press: 7.5 Pl 15 x Standing Heel Raises: Limitations Heel Raises Limitations: heel/ toe walking 2RT Wall Squat: 10 reps;5 seconds SLS: R 58"    Physical Therapy Assessment and Plan PT Assessment and Plan Clinical Impression Statement: Re-eval complete.  Pt has had 7 OPPT sessions over 4 weeks and has met 5/5 STGs.  Pt wtih improved perceived lower extremity functional scale with strengthen improved to WNL and ability to ambulate for 2 hours comfortably and plans to return to the [facility] for continued exercises.  LE presentes with decreased fascial restrictions and improved static standing balance with ability to SLS for 58" first attempt.  Pt given HEP exercises to continue functional  strengthening and given appropriate weights to begin at the [facility]. PT Plan: Recommended D/C to HEP per all goals met.    Goals Home Exercise Program Pt will Perform Home Exercise Program: Independently PT Goal: Perform Home Exercise Program - Progress: Progressing toward goal PT Short Term Goals Time to Complete Short Term Goals: 3 weeks PT Short Term Goal 1: Pt will report pain less than 3/10 for 75% of his day for improved QOL.  PT Short Term Goal 1 - Progress: Met PT Short Term Goal 2: Pt will present with decreased fascial restrictions to R knee.  PT Short Term Goal 2 - Progress: Met PT Short Term Goal 3: Pt will improve static standing balance on solid surface with R LE x30 sec.  PT Short Term Goal 3 - Progress: Met (SLS R 58") PT Short Term Goal 4: Pt will improve his LEFS to 42/80 for improved percieved functional ability.  PT Short Term Goal 4 - Progress: Met (LEFS 64/80) PT Short Term Goal 5: Pt will improve LE strength by 1 muscle grade in order to ambulate greater than 1 hour in order to return to exercise activities.  PT Short Term Goal 5 - Progress: Met  Problem List Patient Active Problem List  Diagnosis  . Knee pain  . Leg weakness    PT - End of Session Activity Tolerance: Patient tolerated treatment well General Behavior During Session: [facility] for tasks performed Cognition: [facility] for tasks performed  GP    Juel Burrow 02/12/2012, 4:08 PM

## 2012-02-23 DIAGNOSIS — I209 Angina pectoris, unspecified: Secondary | ICD-10-CM | POA: Diagnosis not present

## 2012-03-01 DIAGNOSIS — R079 Chest pain, unspecified: Secondary | ICD-10-CM | POA: Diagnosis not present

## 2012-05-05 DIAGNOSIS — IMO0002 Reserved for concepts with insufficient information to code with codable children: Secondary | ICD-10-CM | POA: Diagnosis not present

## 2012-05-05 DIAGNOSIS — F3175 Bipolar disorder, in partial remission, most recent episode depressed: Secondary | ICD-10-CM | POA: Diagnosis not present

## 2012-05-24 DIAGNOSIS — M25549 Pain in joints of unspecified hand: Secondary | ICD-10-CM | POA: Diagnosis not present

## 2012-05-31 DIAGNOSIS — IMO0001 Reserved for inherently not codable concepts without codable children: Secondary | ICD-10-CM | POA: Diagnosis not present

## 2012-05-31 DIAGNOSIS — M79609 Pain in unspecified limb: Secondary | ICD-10-CM | POA: Diagnosis not present

## 2012-06-01 DIAGNOSIS — I10 Essential (primary) hypertension: Secondary | ICD-10-CM | POA: Diagnosis not present

## 2012-06-02 DIAGNOSIS — IMO0001 Reserved for inherently not codable concepts without codable children: Secondary | ICD-10-CM | POA: Diagnosis not present

## 2012-06-02 DIAGNOSIS — M79609 Pain in unspecified limb: Secondary | ICD-10-CM | POA: Diagnosis not present

## 2012-06-04 DIAGNOSIS — IMO0001 Reserved for inherently not codable concepts without codable children: Secondary | ICD-10-CM | POA: Diagnosis not present

## 2012-06-04 DIAGNOSIS — M79609 Pain in unspecified limb: Secondary | ICD-10-CM | POA: Diagnosis not present

## 2012-06-07 DIAGNOSIS — M25549 Pain in joints of unspecified hand: Secondary | ICD-10-CM | POA: Diagnosis not present

## 2012-06-10 DIAGNOSIS — M25549 Pain in joints of unspecified hand: Secondary | ICD-10-CM | POA: Diagnosis not present

## 2012-06-10 DIAGNOSIS — IMO0001 Reserved for inherently not codable concepts without codable children: Secondary | ICD-10-CM | POA: Diagnosis not present

## 2012-06-10 DIAGNOSIS — M6281 Muscle weakness (generalized): Secondary | ICD-10-CM | POA: Diagnosis not present

## 2012-06-11 DIAGNOSIS — IMO0001 Reserved for inherently not codable concepts without codable children: Secondary | ICD-10-CM | POA: Diagnosis not present

## 2012-06-11 DIAGNOSIS — M25549 Pain in joints of unspecified hand: Secondary | ICD-10-CM | POA: Diagnosis not present

## 2012-06-11 DIAGNOSIS — M6281 Muscle weakness (generalized): Secondary | ICD-10-CM | POA: Diagnosis not present

## 2012-06-14 DIAGNOSIS — IMO0001 Reserved for inherently not codable concepts without codable children: Secondary | ICD-10-CM | POA: Diagnosis not present

## 2012-06-14 DIAGNOSIS — F3175 Bipolar disorder, in partial remission, most recent episode depressed: Secondary | ICD-10-CM | POA: Diagnosis not present

## 2012-06-14 DIAGNOSIS — M6281 Muscle weakness (generalized): Secondary | ICD-10-CM | POA: Diagnosis not present

## 2012-06-14 DIAGNOSIS — Z79899 Other long term (current) drug therapy: Secondary | ICD-10-CM | POA: Diagnosis not present

## 2012-06-14 DIAGNOSIS — M25549 Pain in joints of unspecified hand: Secondary | ICD-10-CM | POA: Diagnosis not present

## 2012-06-14 DIAGNOSIS — IMO0002 Reserved for concepts with insufficient information to code with codable children: Secondary | ICD-10-CM | POA: Diagnosis not present

## 2012-06-17 DIAGNOSIS — IMO0001 Reserved for inherently not codable concepts without codable children: Secondary | ICD-10-CM | POA: Diagnosis not present

## 2012-06-17 DIAGNOSIS — M6281 Muscle weakness (generalized): Secondary | ICD-10-CM | POA: Diagnosis not present

## 2012-06-17 DIAGNOSIS — M25549 Pain in joints of unspecified hand: Secondary | ICD-10-CM | POA: Diagnosis not present

## 2012-06-18 DIAGNOSIS — M6281 Muscle weakness (generalized): Secondary | ICD-10-CM | POA: Diagnosis not present

## 2012-06-18 DIAGNOSIS — IMO0001 Reserved for inherently not codable concepts without codable children: Secondary | ICD-10-CM | POA: Diagnosis not present

## 2012-06-18 DIAGNOSIS — M25549 Pain in joints of unspecified hand: Secondary | ICD-10-CM | POA: Diagnosis not present

## 2012-06-22 DIAGNOSIS — IMO0001 Reserved for inherently not codable concepts without codable children: Secondary | ICD-10-CM | POA: Diagnosis not present

## 2012-06-22 DIAGNOSIS — M25549 Pain in joints of unspecified hand: Secondary | ICD-10-CM | POA: Diagnosis not present

## 2012-06-22 DIAGNOSIS — M6281 Muscle weakness (generalized): Secondary | ICD-10-CM | POA: Diagnosis not present

## 2012-06-25 DIAGNOSIS — IMO0001 Reserved for inherently not codable concepts without codable children: Secondary | ICD-10-CM | POA: Diagnosis not present

## 2012-06-25 DIAGNOSIS — M6281 Muscle weakness (generalized): Secondary | ICD-10-CM | POA: Diagnosis not present

## 2012-06-25 DIAGNOSIS — M25549 Pain in joints of unspecified hand: Secondary | ICD-10-CM | POA: Diagnosis not present

## 2012-06-28 DIAGNOSIS — M25549 Pain in joints of unspecified hand: Secondary | ICD-10-CM | POA: Diagnosis not present

## 2012-06-28 DIAGNOSIS — IMO0001 Reserved for inherently not codable concepts without codable children: Secondary | ICD-10-CM | POA: Diagnosis not present

## 2012-06-28 DIAGNOSIS — M6281 Muscle weakness (generalized): Secondary | ICD-10-CM | POA: Diagnosis not present

## 2012-06-30 DIAGNOSIS — IMO0001 Reserved for inherently not codable concepts without codable children: Secondary | ICD-10-CM | POA: Diagnosis not present

## 2012-06-30 DIAGNOSIS — M25549 Pain in joints of unspecified hand: Secondary | ICD-10-CM | POA: Diagnosis not present

## 2012-06-30 DIAGNOSIS — M6281 Muscle weakness (generalized): Secondary | ICD-10-CM | POA: Diagnosis not present

## 2012-07-06 DIAGNOSIS — M6281 Muscle weakness (generalized): Secondary | ICD-10-CM | POA: Diagnosis not present

## 2012-07-06 DIAGNOSIS — IMO0001 Reserved for inherently not codable concepts without codable children: Secondary | ICD-10-CM | POA: Diagnosis not present

## 2012-07-06 DIAGNOSIS — M25549 Pain in joints of unspecified hand: Secondary | ICD-10-CM | POA: Diagnosis not present

## 2012-07-08 DIAGNOSIS — M25549 Pain in joints of unspecified hand: Secondary | ICD-10-CM | POA: Diagnosis not present

## 2012-07-08 DIAGNOSIS — IMO0001 Reserved for inherently not codable concepts without codable children: Secondary | ICD-10-CM | POA: Diagnosis not present

## 2012-07-08 DIAGNOSIS — M6281 Muscle weakness (generalized): Secondary | ICD-10-CM | POA: Diagnosis not present

## 2012-07-09 DIAGNOSIS — M25549 Pain in joints of unspecified hand: Secondary | ICD-10-CM | POA: Diagnosis not present

## 2012-07-09 DIAGNOSIS — M6281 Muscle weakness (generalized): Secondary | ICD-10-CM | POA: Diagnosis not present

## 2012-07-09 DIAGNOSIS — IMO0001 Reserved for inherently not codable concepts without codable children: Secondary | ICD-10-CM | POA: Diagnosis not present

## 2012-07-15 ENCOUNTER — Other Ambulatory Visit (HOSPITAL_COMMUNITY): Payer: Self-pay | Admitting: *Deleted

## 2012-07-15 ENCOUNTER — Ambulatory Visit (HOSPITAL_COMMUNITY)
Admission: RE | Admit: 2012-07-15 | Discharge: 2012-07-15 | Disposition: A | Discharge status: Home / Self Care | Payer: Medicare Other | Source: Ambulatory Visit | Attending: Internal Medicine | Admitting: Internal Medicine

## 2012-07-15 DIAGNOSIS — S70259A Superficial foreign body, unspecified hip, initial encounter: Secondary | ICD-10-CM | POA: Diagnosis not present

## 2012-07-15 DIAGNOSIS — S99911A Unspecified injury of right ankle, initial encounter: Secondary | ICD-10-CM

## 2012-07-15 DIAGNOSIS — M25579 Pain in unspecified ankle and joints of unspecified foot: Secondary | ICD-10-CM | POA: Diagnosis not present

## 2012-07-15 DIAGNOSIS — X58XXXA Exposure to other specified factors, initial encounter: Secondary | ICD-10-CM | POA: Insufficient documentation

## 2012-07-15 DIAGNOSIS — M25549 Pain in joints of unspecified hand: Secondary | ICD-10-CM | POA: Diagnosis not present

## 2012-08-31 DIAGNOSIS — I10 Essential (primary) hypertension: Secondary | ICD-10-CM | POA: Diagnosis not present

## 2012-08-31 DIAGNOSIS — Z0000 Encounter for general adult medical examination without abnormal findings: Secondary | ICD-10-CM | POA: Diagnosis not present

## 2012-11-16 DIAGNOSIS — F3175 Bipolar disorder, in partial remission, most recent episode depressed: Secondary | ICD-10-CM | POA: Diagnosis not present

## 2012-11-16 DIAGNOSIS — IMO0002 Reserved for concepts with insufficient information to code with codable children: Secondary | ICD-10-CM | POA: Diagnosis not present

## 2012-12-07 DIAGNOSIS — I10 Essential (primary) hypertension: Secondary | ICD-10-CM | POA: Diagnosis not present

## 2012-12-15 ENCOUNTER — Ambulatory Visit (INDEPENDENT_AMBULATORY_CARE_PROVIDER_SITE_OTHER): Payer: Medicare Other | Admitting: Orthopedic Surgery

## 2012-12-15 ENCOUNTER — Telehealth: Payer: Self-pay | Admitting: *Deleted

## 2012-12-15 ENCOUNTER — Other Ambulatory Visit: Payer: Self-pay | Admitting: *Deleted

## 2012-12-15 VITALS — BP 125/76 | Ht 72.0 in | Wt 270.0 lb

## 2012-12-15 DIAGNOSIS — S6390XA Sprain of unspecified part of unspecified wrist and hand, initial encounter: Secondary | ICD-10-CM

## 2012-12-15 DIAGNOSIS — M20021 Boutonniere deformity of right finger(s): Secondary | ICD-10-CM

## 2012-12-15 DIAGNOSIS — M20029 Boutonniere deformity of unspecified finger(s): Secondary | ICD-10-CM | POA: Insufficient documentation

## 2012-12-15 DIAGNOSIS — S63619A Unspecified sprain of unspecified finger, initial encounter: Secondary | ICD-10-CM | POA: Insufficient documentation

## 2012-12-15 NOTE — Progress Notes (Signed)
Patient ID: Paul Simmons, male   DOB: 07/13/1980, 32 y.o.   MRN: [id number]  Chief Complaint  Patient presents with  . Hand Pain    Right middle finger. DOI 03-26-12. Second opinion.    History this is a 32 year old ambidextrous male who is predominantly right-handed who injured his finger February of 2014. He injured the right middle finger sustaining a violent flexion force and had pain and swelling over the PIP joint. He waited about 2 months before he saw Dr. Hilda Lias. He was diagnosed with a PIP joint injury and went to therapy he never really got better. He complains of pain swelling at the PIP joint and lack of extension is also difficult for him to flex the PIP joint.  He has a history of chest pain on review of systems with heartburn anxiety and depression otherwise systems are normal  He's had multiple surgeries on his right ankle after falling off a 3 story roof. Dr. Carola Frost has perform multiple procedures to try to reconstruct that. He has bipolar disease PTSD surgery for trimalleolar fracture right ankle  Medications are Depakote, Lamictal, have a statin. Family history heart disease  Single disabled does not smoke or drink  I have notes from Dr. Hilda Lias which verify the patient's treatment course and history.  BP 125/76  Ht 6' (1.829 m)  Wt 270 lb (122.471 kg)  BMI 36.61 kg/m2 General appearance is normal, the patient is alert and oriented x3 with normal mood and affect. He has swelling of the PIP joint decreased flexion decreased extension with gravity removed he cannot extend the PIP joint there is tenderness over the PIP joint dorsally he has some pain in the palm of the hand but can flex the PIP joint and DIP joint although it is painful. Skin is intact without laceration distal pulses of the radial artery is intact capillary refill is normal there is no sensory deficit and has no enlarged epitrochlear lymph nodes  An x-ray shows no fracture  Impression central slip were  extensor mechanism injury to the PIP joint with delayed presentation.  Recommend hand consult

## 2012-12-15 NOTE — Telephone Encounter (Signed)
Office notes and referral faxed to [facility]. Awaiting appointment.

## 2012-12-15 NOTE — Patient Instructions (Signed)
Refer to DR. Gramig for Extensor Tendon Rupture ( Central Slip)

## 2013-01-10 NOTE — Telephone Encounter (Signed)
Patient has an appointment with DR. Gramig 01/24/13 at 1:00 pm.

## 2013-01-24 DIAGNOSIS — M79609 Pain in unspecified limb: Secondary | ICD-10-CM | POA: Diagnosis not present

## 2013-01-28 DIAGNOSIS — M79609 Pain in unspecified limb: Secondary | ICD-10-CM | POA: Diagnosis not present

## 2013-03-04 DIAGNOSIS — G4730 Sleep apnea, unspecified: Secondary | ICD-10-CM | POA: Diagnosis not present

## 2013-03-04 DIAGNOSIS — I10 Essential (primary) hypertension: Secondary | ICD-10-CM | POA: Diagnosis not present

## 2013-03-04 DIAGNOSIS — M79609 Pain in unspecified limb: Secondary | ICD-10-CM | POA: Diagnosis not present

## 2013-03-18 DIAGNOSIS — G4733 Obstructive sleep apnea (adult) (pediatric): Secondary | ICD-10-CM | POA: Diagnosis not present

## 2013-03-25 DIAGNOSIS — S335XXA Sprain of ligaments of lumbar spine, initial encounter: Secondary | ICD-10-CM | POA: Diagnosis not present

## 2013-05-06 DIAGNOSIS — F3175 Bipolar disorder, in partial remission, most recent episode depressed: Secondary | ICD-10-CM | POA: Diagnosis not present

## 2013-05-06 DIAGNOSIS — IMO0002 Reserved for concepts with insufficient information to code with codable children: Secondary | ICD-10-CM | POA: Diagnosis not present

## 2013-05-13 DIAGNOSIS — G4733 Obstructive sleep apnea (adult) (pediatric): Secondary | ICD-10-CM | POA: Diagnosis not present

## 2013-05-13 DIAGNOSIS — Z6837 Body mass index (BMI) 37.0-37.9, adult: Secondary | ICD-10-CM | POA: Diagnosis not present

## 2013-05-14 DIAGNOSIS — R059 Cough, unspecified: Secondary | ICD-10-CM | POA: Diagnosis not present

## 2013-05-14 DIAGNOSIS — R05 Cough: Secondary | ICD-10-CM | POA: Diagnosis not present

## 2013-05-14 DIAGNOSIS — F4310 Post-traumatic stress disorder, unspecified: Secondary | ICD-10-CM | POA: Diagnosis not present

## 2013-05-14 DIAGNOSIS — K5289 Other specified noninfective gastroenteritis and colitis: Secondary | ICD-10-CM | POA: Diagnosis not present

## 2013-05-14 DIAGNOSIS — Z79899 Other long term (current) drug therapy: Secondary | ICD-10-CM | POA: Diagnosis not present

## 2013-05-14 DIAGNOSIS — R1110 Vomiting, unspecified: Secondary | ICD-10-CM | POA: Diagnosis not present

## 2013-05-14 DIAGNOSIS — F319 Bipolar disorder, unspecified: Secondary | ICD-10-CM | POA: Diagnosis not present

## 2013-05-20 DIAGNOSIS — G4733 Obstructive sleep apnea (adult) (pediatric): Secondary | ICD-10-CM | POA: Diagnosis not present

## 2013-06-07 DIAGNOSIS — G4733 Obstructive sleep apnea (adult) (pediatric): Secondary | ICD-10-CM | POA: Diagnosis not present

## 2013-06-07 DIAGNOSIS — Z6837 Body mass index (BMI) 37.0-37.9, adult: Secondary | ICD-10-CM | POA: Diagnosis not present

## 2013-06-13 DIAGNOSIS — G4733 Obstructive sleep apnea (adult) (pediatric): Secondary | ICD-10-CM | POA: Diagnosis not present

## 2013-07-09 DIAGNOSIS — X500XXA Overexertion from strenuous movement or load, initial encounter: Secondary | ICD-10-CM | POA: Diagnosis not present

## 2013-07-09 DIAGNOSIS — S46909A Unspecified injury of unspecified muscle, fascia and tendon at shoulder and upper arm level, unspecified arm, initial encounter: Secondary | ICD-10-CM | POA: Diagnosis not present

## 2013-07-09 DIAGNOSIS — S4980XA Other specified injuries of shoulder and upper arm, unspecified arm, initial encounter: Secondary | ICD-10-CM | POA: Diagnosis not present

## 2013-07-09 DIAGNOSIS — X503XXA Overexertion from repetitive movements, initial encounter: Secondary | ICD-10-CM | POA: Diagnosis not present

## 2013-07-29 DIAGNOSIS — M549 Dorsalgia, unspecified: Secondary | ICD-10-CM | POA: Diagnosis not present

## 2013-08-08 DIAGNOSIS — M25519 Pain in unspecified shoulder: Secondary | ICD-10-CM | POA: Diagnosis not present

## 2013-08-10 DIAGNOSIS — M25519 Pain in unspecified shoulder: Secondary | ICD-10-CM | POA: Diagnosis not present

## 2013-08-11 DIAGNOSIS — A938 Other specified arthropod-borne viral fevers: Secondary | ICD-10-CM | POA: Diagnosis not present

## 2013-09-02 DIAGNOSIS — G4733 Obstructive sleep apnea (adult) (pediatric): Secondary | ICD-10-CM | POA: Diagnosis not present

## 2013-10-26 DIAGNOSIS — M25519 Pain in unspecified shoulder: Secondary | ICD-10-CM | POA: Diagnosis not present

## 2013-11-04 DIAGNOSIS — E782 Mixed hyperlipidemia: Secondary | ICD-10-CM | POA: Diagnosis not present

## 2013-11-04 DIAGNOSIS — I10 Essential (primary) hypertension: Secondary | ICD-10-CM | POA: Diagnosis not present

## 2013-11-04 DIAGNOSIS — M19019 Primary osteoarthritis, unspecified shoulder: Secondary | ICD-10-CM | POA: Diagnosis not present

## 2013-11-18 DIAGNOSIS — F3175 Bipolar disorder, in partial remission, most recent episode depressed: Secondary | ICD-10-CM | POA: Diagnosis not present

## 2013-12-30 DIAGNOSIS — G4733 Obstructive sleep apnea (adult) (pediatric): Secondary | ICD-10-CM | POA: Diagnosis not present

## 2014-01-22 DIAGNOSIS — S62602A Fracture of unspecified phalanx of right middle finger, initial encounter for closed fracture: Secondary | ICD-10-CM | POA: Diagnosis not present

## 2014-01-25 DIAGNOSIS — M79644 Pain in right finger(s): Secondary | ICD-10-CM | POA: Diagnosis not present

## 2014-02-13 DIAGNOSIS — M25441 Effusion, right hand: Secondary | ICD-10-CM | POA: Diagnosis not present

## 2014-02-13 DIAGNOSIS — M79644 Pain in right finger(s): Secondary | ICD-10-CM | POA: Diagnosis not present

## 2014-02-20 DIAGNOSIS — M79644 Pain in right finger(s): Secondary | ICD-10-CM | POA: Diagnosis not present

## 2014-02-20 DIAGNOSIS — M659 Synovitis and tenosynovitis, unspecified: Secondary | ICD-10-CM | POA: Diagnosis not present

## 2014-02-20 DIAGNOSIS — M79609 Pain in unspecified limb: Secondary | ICD-10-CM | POA: Diagnosis not present

## 2014-03-16 DIAGNOSIS — M65841 Other synovitis and tenosynovitis, right hand: Secondary | ICD-10-CM | POA: Diagnosis not present

## 2014-03-16 DIAGNOSIS — M67449 Ganglion, unspecified hand: Secondary | ICD-10-CM | POA: Diagnosis not present

## 2014-03-16 DIAGNOSIS — M659 Synovitis and tenosynovitis, unspecified: Secondary | ICD-10-CM | POA: Diagnosis not present

## 2014-03-20 DIAGNOSIS — J45909 Unspecified asthma, uncomplicated: Secondary | ICD-10-CM | POA: Diagnosis not present

## 2014-03-20 DIAGNOSIS — E785 Hyperlipidemia, unspecified: Secondary | ICD-10-CM | POA: Diagnosis not present

## 2014-03-20 DIAGNOSIS — R05 Cough: Secondary | ICD-10-CM | POA: Diagnosis not present

## 2014-03-20 DIAGNOSIS — R0602 Shortness of breath: Secondary | ICD-10-CM | POA: Diagnosis not present

## 2014-03-20 DIAGNOSIS — J45998 Other asthma: Secondary | ICD-10-CM | POA: Diagnosis not present

## 2014-03-20 DIAGNOSIS — F319 Bipolar disorder, unspecified: Secondary | ICD-10-CM | POA: Diagnosis not present

## 2014-03-20 DIAGNOSIS — R079 Chest pain, unspecified: Secondary | ICD-10-CM | POA: Diagnosis not present

## 2014-03-30 DIAGNOSIS — M659 Synovitis and tenosynovitis, unspecified: Secondary | ICD-10-CM | POA: Diagnosis not present

## 2014-03-30 DIAGNOSIS — M79644 Pain in right finger(s): Secondary | ICD-10-CM | POA: Diagnosis not present

## 2014-03-30 DIAGNOSIS — Z4789 Encounter for other orthopedic aftercare: Secondary | ICD-10-CM | POA: Diagnosis not present

## 2014-04-07 DIAGNOSIS — M79644 Pain in right finger(s): Secondary | ICD-10-CM | POA: Diagnosis not present

## 2014-04-17 DIAGNOSIS — M79644 Pain in right finger(s): Secondary | ICD-10-CM | POA: Diagnosis not present

## 2014-04-17 DIAGNOSIS — Z4789 Encounter for other orthopedic aftercare: Secondary | ICD-10-CM | POA: Diagnosis not present

## 2014-04-28 DIAGNOSIS — M79644 Pain in right finger(s): Secondary | ICD-10-CM | POA: Diagnosis not present

## 2014-05-19 DIAGNOSIS — Z131 Encounter for screening for diabetes mellitus: Secondary | ICD-10-CM | POA: Diagnosis not present

## 2014-05-19 DIAGNOSIS — Z4789 Encounter for other orthopedic aftercare: Secondary | ICD-10-CM | POA: Diagnosis not present

## 2014-05-19 DIAGNOSIS — Z1389 Encounter for screening for other disorder: Secondary | ICD-10-CM | POA: Diagnosis not present

## 2014-05-19 DIAGNOSIS — M1990 Unspecified osteoarthritis, unspecified site: Secondary | ICD-10-CM | POA: Diagnosis not present

## 2014-05-19 DIAGNOSIS — E782 Mixed hyperlipidemia: Secondary | ICD-10-CM | POA: Diagnosis not present

## 2014-05-19 DIAGNOSIS — Z0000 Encounter for general adult medical examination without abnormal findings: Secondary | ICD-10-CM | POA: Diagnosis not present

## 2014-09-29 DIAGNOSIS — E782 Mixed hyperlipidemia: Secondary | ICD-10-CM | POA: Diagnosis not present

## 2014-09-29 DIAGNOSIS — E039 Hypothyroidism, unspecified: Secondary | ICD-10-CM | POA: Diagnosis not present

## 2014-09-29 DIAGNOSIS — M1990 Unspecified osteoarthritis, unspecified site: Secondary | ICD-10-CM | POA: Diagnosis not present

## 2014-09-29 DIAGNOSIS — Z131 Encounter for screening for diabetes mellitus: Secondary | ICD-10-CM | POA: Diagnosis not present

## 2015-01-26 DIAGNOSIS — F3189 Other bipolar disorder: Secondary | ICD-10-CM | POA: Diagnosis not present

## 2015-01-26 DIAGNOSIS — F902 Attention-deficit hyperactivity disorder, combined type: Secondary | ICD-10-CM | POA: Diagnosis not present

## 2015-01-26 DIAGNOSIS — E782 Mixed hyperlipidemia: Secondary | ICD-10-CM | POA: Diagnosis not present

## 2015-03-28 DIAGNOSIS — M79644 Pain in right finger(s): Secondary | ICD-10-CM | POA: Diagnosis not present

## 2015-04-06 DIAGNOSIS — M79644 Pain in right finger(s): Secondary | ICD-10-CM | POA: Diagnosis not present

## 2015-05-03 DIAGNOSIS — M24041 Loose body in right finger joint(s): Secondary | ICD-10-CM | POA: Diagnosis not present

## 2015-05-03 DIAGNOSIS — G8918 Other acute postprocedural pain: Secondary | ICD-10-CM | POA: Diagnosis not present

## 2015-05-03 DIAGNOSIS — M659 Synovitis and tenosynovitis, unspecified: Secondary | ICD-10-CM | POA: Diagnosis not present

## 2015-05-03 DIAGNOSIS — M24042 Loose body in left finger joint(s): Secondary | ICD-10-CM | POA: Diagnosis not present

## 2015-05-17 DIAGNOSIS — Z4789 Encounter for other orthopedic aftercare: Secondary | ICD-10-CM | POA: Diagnosis not present

## 2015-05-17 DIAGNOSIS — M79644 Pain in right finger(s): Secondary | ICD-10-CM | POA: Diagnosis not present

## 2015-06-11 DIAGNOSIS — Z4789 Encounter for other orthopedic aftercare: Secondary | ICD-10-CM | POA: Diagnosis not present

## 2015-06-19 DIAGNOSIS — Z79899 Other long term (current) drug therapy: Secondary | ICD-10-CM | POA: Diagnosis not present

## 2015-06-19 DIAGNOSIS — L570 Actinic keratosis: Secondary | ICD-10-CM | POA: Diagnosis not present

## 2015-06-19 DIAGNOSIS — E782 Mixed hyperlipidemia: Secondary | ICD-10-CM | POA: Diagnosis not present

## 2015-08-31 DIAGNOSIS — N181 Chronic kidney disease, stage 1: Secondary | ICD-10-CM | POA: Diagnosis not present

## 2015-08-31 DIAGNOSIS — E038 Other specified hypothyroidism: Secondary | ICD-10-CM | POA: Diagnosis not present

## 2015-08-31 DIAGNOSIS — E784 Other hyperlipidemia: Secondary | ICD-10-CM | POA: Diagnosis not present

## 2015-09-13 DIAGNOSIS — E784 Other hyperlipidemia: Secondary | ICD-10-CM | POA: Diagnosis not present

## 2015-09-13 DIAGNOSIS — E038 Other specified hypothyroidism: Secondary | ICD-10-CM | POA: Diagnosis not present

## 2015-12-12 DIAGNOSIS — E784 Other hyperlipidemia: Secondary | ICD-10-CM | POA: Diagnosis not present

## 2015-12-12 DIAGNOSIS — E038 Other specified hypothyroidism: Secondary | ICD-10-CM | POA: Diagnosis not present

## 2015-12-20 DIAGNOSIS — E038 Other specified hypothyroidism: Secondary | ICD-10-CM | POA: Diagnosis not present

## 2016-02-27 DIAGNOSIS — E784 Other hyperlipidemia: Secondary | ICD-10-CM | POA: Diagnosis not present

## 2016-02-27 DIAGNOSIS — E038 Other specified hypothyroidism: Secondary | ICD-10-CM | POA: Diagnosis not present

## 2016-03-07 DIAGNOSIS — E038 Other specified hypothyroidism: Secondary | ICD-10-CM | POA: Diagnosis not present

## 2016-03-07 DIAGNOSIS — Z0000 Encounter for general adult medical examination without abnormal findings: Secondary | ICD-10-CM | POA: Diagnosis not present

## 2016-03-07 DIAGNOSIS — E784 Other hyperlipidemia: Secondary | ICD-10-CM | POA: Diagnosis not present

## 2016-03-07 DIAGNOSIS — N181 Chronic kidney disease, stage 1: Secondary | ICD-10-CM | POA: Diagnosis not present

## 2016-03-07 DIAGNOSIS — Z1389 Encounter for screening for other disorder: Secondary | ICD-10-CM | POA: Diagnosis not present

## 2016-04-10 DIAGNOSIS — E784 Other hyperlipidemia: Secondary | ICD-10-CM | POA: Diagnosis not present

## 2016-04-10 DIAGNOSIS — E038 Other specified hypothyroidism: Secondary | ICD-10-CM | POA: Diagnosis not present

## 2016-05-08 ENCOUNTER — Other Ambulatory Visit: Payer: Self-pay | Admitting: Surgery

## 2016-05-08 DIAGNOSIS — R1011 Right upper quadrant pain: Secondary | ICD-10-CM

## 2016-05-14 ENCOUNTER — Inpatient Hospital Stay (HOSPITAL_COMMUNITY)
Admission: AD | Admit: 2016-05-14 | Discharge: 2016-05-16 | DRG: 419 | Disposition: A | Discharge status: Home / Self Care | Payer: Medicare Other | Source: Ambulatory Visit | Attending: Surgery | Admitting: Surgery

## 2016-05-14 ENCOUNTER — Ambulatory Visit
Admission: RE | Admit: 2016-05-14 | Discharge: 2016-05-14 | Disposition: A | Discharge status: Home / Self Care | Payer: Medicare Other | Source: Ambulatory Visit | Attending: Surgery | Admitting: Surgery

## 2016-05-14 ENCOUNTER — Encounter (HOSPITAL_COMMUNITY): Payer: Self-pay

## 2016-05-14 DIAGNOSIS — Z9049 Acquired absence of other specified parts of digestive tract: Secondary | ICD-10-CM

## 2016-05-14 DIAGNOSIS — F4310 Post-traumatic stress disorder, unspecified: Secondary | ICD-10-CM

## 2016-05-14 DIAGNOSIS — N289 Disorder of kidney and ureter, unspecified: Secondary | ICD-10-CM | POA: Diagnosis present

## 2016-05-14 DIAGNOSIS — G4730 Sleep apnea, unspecified: Secondary | ICD-10-CM | POA: Diagnosis present

## 2016-05-14 DIAGNOSIS — E785 Hyperlipidemia, unspecified: Secondary | ICD-10-CM | POA: Diagnosis present

## 2016-05-14 DIAGNOSIS — K810 Acute cholecystitis: Secondary | ICD-10-CM | POA: Diagnosis not present

## 2016-05-14 DIAGNOSIS — Z419 Encounter for procedure for purposes other than remedying health state, unspecified: Secondary | ICD-10-CM

## 2016-05-14 DIAGNOSIS — Z79899 Other long term (current) drug therapy: Secondary | ICD-10-CM

## 2016-05-14 DIAGNOSIS — Z6835 Body mass index (BMI) 35.0-35.9, adult: Secondary | ICD-10-CM

## 2016-05-14 DIAGNOSIS — K8020 Calculus of gallbladder without cholecystitis without obstruction: Secondary | ICD-10-CM | POA: Diagnosis not present

## 2016-05-14 DIAGNOSIS — E039 Hypothyroidism, unspecified: Secondary | ICD-10-CM | POA: Diagnosis not present

## 2016-05-14 DIAGNOSIS — F319 Bipolar disorder, unspecified: Secondary | ICD-10-CM

## 2016-05-14 DIAGNOSIS — E669 Obesity, unspecified: Secondary | ICD-10-CM | POA: Diagnosis not present

## 2016-05-14 DIAGNOSIS — G4733 Obstructive sleep apnea (adult) (pediatric): Secondary | ICD-10-CM

## 2016-05-14 DIAGNOSIS — R1011 Right upper quadrant pain: Secondary | ICD-10-CM

## 2016-05-14 DIAGNOSIS — K219 Gastro-esophageal reflux disease without esophagitis: Secondary | ICD-10-CM | POA: Diagnosis not present

## 2016-05-14 DIAGNOSIS — K8010 Calculus of gallbladder with chronic cholecystitis without obstruction: Principal | ICD-10-CM

## 2016-05-14 DIAGNOSIS — K811 Chronic cholecystitis: Secondary | ICD-10-CM | POA: Diagnosis not present

## 2016-05-14 HISTORY — DX: Calculus of gallbladder with chronic cholecystitis without obstruction: K80.10

## 2016-05-14 HISTORY — DX: Post-traumatic stress disorder, unspecified: F43.10

## 2016-05-14 HISTORY — DX: Obstructive sleep apnea (adult) (pediatric): G47.33

## 2016-05-14 HISTORY — DX: Hyperlipidemia, unspecified: E78.5

## 2016-05-14 HISTORY — DX: Bipolar disorder, unspecified: F31.9

## 2016-05-14 HISTORY — DX: Hypothyroidism, unspecified: E03.9

## 2016-05-14 LAB — CBC WITH DIFFERENTIAL/PLATELET
BASOS ABS: 0 10*3/uL (ref 0.0–0.1)
Basophils Relative: 0 %
EOS ABS: 0.1 10*3/uL (ref 0.0–0.7)
EOS PCT: 1 %
HCT: 41.2 % (ref 39.0–52.0)
Hemoglobin: 14.8 g/dL (ref 13.0–17.0)
LYMPHS PCT: 38 %
Lymphs Abs: 1.8 10*3/uL (ref 0.7–4.0)
MCH: 30.9 pg (ref 26.0–34.0)
MCHC: 35.9 g/dL (ref 30.0–36.0)
MCV: 86 fL (ref 78.0–100.0)
MONO ABS: 0.5 10*3/uL (ref 0.1–1.0)
Monocytes Relative: 10 %
Neutro Abs: 2.3 10*3/uL (ref 1.7–7.7)
Neutrophils Relative %: 51 %
PLATELETS: 167 10*3/uL (ref 150–400)
RBC: 4.79 MIL/uL (ref 4.22–5.81)
RDW: 12.3 % (ref 11.5–15.5)
WBC: 4.6 10*3/uL (ref 4.0–10.5)

## 2016-05-14 LAB — COMPREHENSIVE METABOLIC PANEL
ALBUMIN: 4.5 g/dL (ref 3.5–5.0)
ALK PHOS: 61 U/L (ref 38–126)
ALT: 36 U/L (ref 17–63)
AST: 24 U/L (ref 15–41)
Anion gap: 7 (ref 5–15)
BILIRUBIN TOTAL: 0.9 mg/dL (ref 0.3–1.2)
BUN: 14 mg/dL (ref 6–20)
CALCIUM: 9 mg/dL (ref 8.9–10.3)
CO2: 24 mmol/L (ref 22–32)
Chloride: 108 mmol/L (ref 101–111)
Creatinine, Ser: 1.36 mg/dL — ABNORMAL HIGH (ref 0.61–1.24)
GFR calc Af Amer: >60 mL/min (ref >60)
GLUCOSE: 83 mg/dL (ref 65–99)
POTASSIUM: 3.6 mmol/L (ref 3.5–5.1)
Sodium: 139 mmol/L (ref 135–145)
TOTAL PROTEIN: 7.1 g/dL (ref 6.5–8.1)

## 2016-05-14 LAB — SURGICAL PCR SCREEN
MRSA, PCR: NEGATIVE
Staphylococcus aureus: NEGATIVE

## 2016-05-14 MED ORDER — LAMOTRIGINE 100 MG PO TABS
200.0000 mg | ORAL_TABLET | Freq: Every day | ORAL | Status: DC
  Administered 2016-05-14: 200 mg via ORAL
  Filled 2016-05-14: qty 2

## 2016-05-14 MED ORDER — SIMETHICONE 80 MG PO CHEW: 40.0000 mg | CHEWABLE_TABLET | Freq: Four times a day (QID) | ORAL | Status: DC | PRN

## 2016-05-14 MED ORDER — KCL IN DEXTROSE-NACL 20-5-0.45 MEQ/L-%-% IV SOLN
INTRAVENOUS | Status: DC
  Administered 2016-05-14: 16:00:00 via INTRAVENOUS
  Filled 2016-05-14 (×2): qty 1000

## 2016-05-14 MED ORDER — DIVALPROEX SODIUM ER 500 MG PO TB24
2000.0000 mg | ORAL_TABLET | Freq: Every day | ORAL | Status: DC
  Administered 2016-05-14: 2000 mg via ORAL
  Filled 2016-05-14: qty 4

## 2016-05-14 MED ORDER — ATORVASTATIN CALCIUM 20 MG PO TABS
20.0000 mg | ORAL_TABLET | Freq: Every day | ORAL | Status: DC
  Administered 2016-05-14: 20 mg via ORAL
  Filled 2016-05-14: qty 1

## 2016-05-14 MED ORDER — PIPERACILLIN-TAZOBACTAM 3.375 G IVPB
3.3750 g | Freq: Three times a day (TID) | INTRAVENOUS | Status: DC
  Administered 2016-05-14 – 2016-05-15 (×3): 3.375 g via INTRAVENOUS
  Filled 2016-05-14 (×4): qty 50

## 2016-05-14 MED ORDER — ORAL CARE MOUTH RINSE: 15.0000 mL | Freq: Two times a day (BID) | OROMUCOSAL | Status: DC

## 2016-05-14 MED ORDER — ONDANSETRON HCL 4 MG/2ML IJ SOLN: 4.0000 mg | Freq: Four times a day (QID) | INTRAMUSCULAR | Status: DC | PRN

## 2016-05-14 MED ORDER — ONDANSETRON 4 MG PO TBDP: 4.0000 mg | ORAL_TABLET | Freq: Four times a day (QID) | ORAL | Status: DC | PRN

## 2016-05-14 MED ORDER — HEPARIN SODIUM (PORCINE) 5000 UNIT/ML IJ SOLN
5000.0000 [IU] | Freq: Three times a day (TID) | INTRAMUSCULAR | Status: DC
  Administered 2016-05-14: 5000 [IU] via SUBCUTANEOUS
  Filled 2016-05-14: qty 1

## 2016-05-14 MED ORDER — PANTOPRAZOLE SODIUM 40 MG IV SOLR
40.0000 mg | Freq: Every day | INTRAVENOUS | Status: DC
  Administered 2016-05-14: 40 mg via INTRAVENOUS
  Filled 2016-05-14: qty 40

## 2016-05-14 MED ORDER — MORPHINE SULFATE (PF) 2 MG/ML IV SOLN: 1.0000 mg | INTRAVENOUS | Status: DC | PRN

## 2016-05-14 MED ORDER — LEVOTHYROXINE SODIUM 50 MCG PO TABS: 50.0000 ug | ORAL_TABLET | Freq: Every day | ORAL | Status: DC

## 2016-05-14 MED ORDER — PHENTERMINE HCL 37.5 MG PO CAPS: 37.5000 mg | ORAL_CAPSULE | ORAL | Status: DC

## 2016-05-14 MED ORDER — KCL IN DEXTROSE-NACL 20-5-0.9 MEQ/L-%-% IV SOLN
INTRAVENOUS | Status: DC
  Administered 2016-05-14 – 2016-05-15 (×2): via INTRAVENOUS
  Filled 2016-05-14 (×3): qty 1000

## 2016-05-14 MED ORDER — TOPIRAMATE 25 MG PO TABS
25.0000 mg | ORAL_TABLET | Freq: Every day | ORAL | Status: DC
  Administered 2016-05-14: 25 mg via ORAL
  Filled 2016-05-14: qty 1

## 2016-05-14 MED ORDER — CHLORHEXIDINE GLUCONATE 0.12 % MT SOLN: 15.0000 mL | Freq: Two times a day (BID) | OROMUCOSAL | Status: DC

## 2016-05-14 NOTE — H&P (Signed)
Paul Simmons is an 36 y.o. male.   Chief Complaint: abdominal pain PCP: Neale Burly, MD  HPI: Patient is a 36 year old male who started having abdominal pain about 2 months ago. It started after eating meals. It was intermittent and occurred once or twice a week. Pain was started about 30-45 minutes after eating. It would last about an hour. He would have episodes of nausea and some emesis with it at times also. Over the last 2 months the episodes become more frequent. It still occurs after eating. He develops abdominal pain; and the pain will last now anywhere from 1 hour up to 4 hours. He developed nausea and vomiting with this. Symptoms became so severe that he spoke to her sister who is a Marine scientist. Symptoms occur about 3-4 time a week now.   He has not seen anyone for this in the past. He was referred for an abdominal ultrasound which shows cholelithiasis with gallbladder wall thickening and focal tenderness over the gallbladder.  After the ultrasound results were completed, Dr. Hassell Done was contacted and asked to see in consultation. He presented to the ER at their request today. Currently he still has some tenderness in his right upper quadrant. He reports pain does go to his back sometimes. Currently no nausea or emesis.  Work up today includes a abdominal ultrasound that shows:  Cholelithiasis with gallbladder wall thickening and focal tenderness over the gallbladder. These are findings concerning for a degree of acute cholecystitis. No pericholecystic fluid seen. Study otherwise unremarkable. Labs show white count of 4.6 hemoglobin 14.8 hematocrit of 41.2 platelets 167,000. CMP shows normal electrolytes. Creatinine is 1.36. LFTs are normal.  Our office was contacted and he is a direct admit by Dr. Hassell Done.    Past Medical History:  Diagnosis Date  . Bipolar disorder ([place]) 05/14/2016  . Chronic cholecystitis with calculus 05/14/2016  . Hyperlipidemia   . Hypothyroidism   . OSA (obstructive sleep  apnea) 05/14/2016   He has a CPAP machine but does not use it.  Marland Kitchen PTSD (post-traumatic stress disorder) 05/14/2016   Former Social research officer, government stationed in [employer]    Past Surgical History:  Procedure Laterality Date  . left varicose vein surgery    . Right Ankle Surgery     x 6 surgeries  . TONSILLECTOMY      History reviewed. No pertinent family history. Social History:  reports that he has never smoked. He has quit using smokeless tobacco. His smokeless tobacco use included Chew. He reports that he does not drink alcohol or use drugs.  tobacco: None EtOH: Social he drinks about every other week Drugs: None He is on disability for his PTSD/bipolar issues, along with his severely injured ankle. 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  Allergies: No Known Allergies  Medications Prior to Admission  Medication Sig Dispense Refill  . atorvastatin (LIPITOR) 20 MG tablet Take 20 mg by mouth at bedtime.    . divalproex (DEPAKOTE ER) 500 MG 24 hr tablet Take 2,000 mg by mouth at bedtime.    . lamoTRIgine (LAMICTAL) 100 MG tablet Take 200 mg by mouth at bedtime.    Marland Kitchen levothyroxine (SYNTHROID, LEVOTHROID) 50 MCG tablet Take 50 mcg by mouth daily before breakfast.    . phentermine 37.5 MG capsule Take 37.5 mg by mouth every morning.    . topiramate (TOPAMAX) 25 MG tablet Take 25 mg by mouth at bedtime.      Results for orders placed or performed  during the hospital encounter of 05/14/16 (from the past 48 hour(s))  Comprehensive metabolic panel     Status: Abnormal   Collection Time: 05/14/16  3:12 PM  Result Value Ref Range   Sodium 139 135 - 145 mmol/L   Potassium 3.6 3.5 - 5.1 mmol/L   Chloride 108 101 - 111 mmol/L   CO2 24 22 - 32 mmol/L   Glucose, Bld 83 65 - 99 mg/dL   BUN 14 6 - 20 mg/dL   Creatinine, Ser 1.36 (H) 0.61 - 1.24 mg/dL   Calcium 9.0 8.9 - 10.3 mg/dL   Total Protein 7.1 6.5 - 8.1 g/dL   Albumin 4.5 3.5 - 5.0 g/dL   AST 24 15 - 41 U/L   ALT 36 17 - 63 U/L   Alkaline  Phosphatase 61 38 - 126 U/L   Total Bilirubin 0.9 0.3 - 1.2 mg/dL   GFR calc non Af Amer >60 >60 mL/min   GFR calc Af Amer >60 >60 mL/min    Comment: (NOTE) The eGFR has been calculated using the CKD EPI equation. This calculation has not been validated in all clinical situations. eGFR's persistently <60 mL/min signify possible Chronic Kidney Disease.    Anion gap 7 5 - 15  CBC WITH DIFFERENTIAL     Status: None   Collection Time: 05/14/16  3:12 PM  Result Value Ref Range   WBC 4.6 4.0 - 10.5 K/uL   RBC 4.79 4.22 - 5.81 MIL/uL   Hemoglobin 14.8 13.0 - 17.0 g/dL   HCT 41.2 39.0 - 52.0 %   MCV 86.0 78.0 - 100.0 fL   MCH 30.9 26.0 - 34.0 pg   MCHC 35.9 30.0 - 36.0 g/dL   RDW 12.3 11.5 - 15.5 %   Platelets 167 150 - 400 K/uL   Neutrophils Relative % 51 %   Neutro Abs 2.3 1.7 - 7.7 K/uL   Lymphocytes Relative 38 %   Lymphs Abs 1.8 0.7 - 4.0 K/uL   Monocytes Relative 10 %   Monocytes Absolute 0.5 0.1 - 1.0 K/uL   Eosinophils Relative 1 %   Eosinophils Absolute 0.1 0.0 - 0.7 K/uL   Basophils Relative 0 %   Basophils Absolute 0.0 0.0 - 0.1 K/uL   US Abdomen Limited Ruq  Result Date: 05/14/2016 CLINICAL DATA:  Right upper quadrant pain with nausea and vomiting EXAM: US ABDOMEN LIMITED - RIGHT UPPER QUADRANT COMPARISON:  CT abdomen and pelvis July 31, 2006 FINDINGS: Gallbladder: Within the gallbladder, there is a 5 mm echogenic focus in the neck of the gallbladder which shadows. There also scattered echogenic foci elsewhere which move and shadow consistent with cholelithiasis. Gallbladder wall is mildly thickened. There is no appreciable gallbladder wall edema or pericholecystic fluid. The patient is focally tender over the gallbladder. Common bile duct: Diameter: 6 mm. No intrahepatic or extrahepatic biliary duct dilatation. Liver: No focal lesion identified. Within normal limits in parenchymal echogenicity. IMPRESSION: Cholelithiasis with gallbladder wall thickening and focal tenderness  over the gallbladder. These are findings concerning for a degree of acute cholecystitis. No pericholecystic fluid seen. Study otherwise unremarkable. These results will be called to the ordering clinician or representative by the Radiologist Assistant, and communication documented in the PACS or zVision Dashboard. Electronically Signed   By: Lowella Grip III M.D.   On: 05/14/2016 08:52    Review of Systems  Constitutional: Positive for weight loss (on Weight loss plan, but no weight loss from illness he is aware of). Negative  for chills, diaphoresis, fever and malaise/fatigue.  HENT: Negative.   Eyes: Negative.   Respiratory: Negative.   Cardiovascular: Negative.   Gastrointestinal: Positive for abdominal pain, diarrhea, heartburn, nausea and vomiting. Negative for blood in stool, constipation and melena.  Genitourinary: Negative.   Musculoskeletal: Negative.   Skin: Negative.   Neurological: Negative.  Negative for weakness.  Endo/Heme/Allergies: Negative.   Psychiatric/Behavioral:       Hxo of Bipolar, PTSD;  Well controlled with current medicines    Blood pressure 118/71, pulse 68, temperature 98.1 F (36.7 C), temperature source Oral, SpO2 98 %. Physical Exam  Constitutional: He is oriented to person, place, and time. He appears well-developed and well-nourished. No distress.  HENT:  Head: Normocephalic and atraumatic.  Mouth/Throat: No oropharyngeal exudate.  Eyes: Conjunctivae and EOM are normal. Right eye exhibits no discharge. Left eye exhibits no discharge. No scleral icterus.  Pupils are equal.  Neck: Normal range of motion. Neck supple. No JVD present. No tracheal deviation present. No thyromegaly present.  No carotid bruits  Cardiovascular: Normal rate, regular rhythm, normal heart sounds and intact distal pulses.   No murmur heard. Respiratory: Effort normal and breath sounds normal. No respiratory distress. He has no wheezes. He has no rales. He exhibits no  tenderness.  GI: Soft. Bowel sounds are normal. He exhibits no distension and no mass. There is tenderness (Ruq is tender to palpation). There is no rebound and no guarding.  Musculoskeletal: He exhibits no edema or tenderness.  Lymphadenopathy:    He has no cervical adenopathy.  Neurological: He is alert and oriented to person, place, and time. No cranial nerve deficit.  Skin: Skin is warm and dry. No rash noted. He is not diaphoretic. No erythema. No pallor.  Psychiatric: He has a normal mood and affect. His behavior is normal. Judgment and thought content normal.     Assessment/Plan Chronic cholecystitis/cholelithiasis. Kidney disease stage I. - Creatinine 1.36 Bipolar disorder PTSD Hyperlipidemia Obstructive sleep apnea Hypothyroid Obesity  Plan: Patient's admitted. We'll plan to keep him on clear liquids this evening. IV hydration, recheck creatinine in the a.m. Plan laparoscopic cholecystectomy in the a.m. He's been started on IV Zosyn by Dr. Hassell Done.    JENNINGS,WILLARD, PA-C 05/14/2016, 4:46 PM

## 2016-05-14 NOTE — Progress Notes (Signed)
Patient received and oriented to room. Dr Hassell Done paged for orders

## 2016-05-15 ENCOUNTER — Inpatient Hospital Stay (HOSPITAL_COMMUNITY): Payer: Medicare Other

## 2016-05-15 ENCOUNTER — Encounter (HOSPITAL_COMMUNITY)
Admission: AD | Disposition: A | Discharge status: Home / Self Care | Payer: Self-pay | Source: Ambulatory Visit | Attending: Surgery

## 2016-05-15 ENCOUNTER — Encounter (HOSPITAL_COMMUNITY): Payer: Self-pay | Admitting: Surgery

## 2016-05-15 ENCOUNTER — Inpatient Hospital Stay (HOSPITAL_COMMUNITY): Payer: Medicare Other | Admitting: Certified Registered Nurse Anesthetist

## 2016-05-15 HISTORY — PX: CHOLECYSTECTOMY: SHX55

## 2016-05-15 LAB — COMPREHENSIVE METABOLIC PANEL
ALBUMIN: 3.8 g/dL (ref 3.5–5.0)
ALT: 31 U/L (ref 17–63)
ANION GAP: 5 (ref 5–15)
AST: 21 U/L (ref 15–41)
Alkaline Phosphatase: 53 U/L (ref 38–126)
BILIRUBIN TOTAL: 0.9 mg/dL (ref 0.3–1.2)
BUN: 11 mg/dL (ref 6–20)
CO2: 25 mmol/L (ref 22–32)
Calcium: 8.3 mg/dL — ABNORMAL LOW (ref 8.9–10.3)
Chloride: 109 mmol/L (ref 101–111)
Creatinine, Ser: 1.29 mg/dL — ABNORMAL HIGH (ref 0.61–1.24)
GFR calc Af Amer: >60 mL/min (ref >60)
GFR calc non Af Amer: >60 mL/min (ref >60)
GLUCOSE: 94 mg/dL (ref 65–99)
POTASSIUM: 3.7 mmol/L (ref 3.5–5.1)
SODIUM: 139 mmol/L (ref 135–145)
TOTAL PROTEIN: 6.3 g/dL — AB (ref 6.5–8.1)

## 2016-05-15 LAB — CBC
HEMATOCRIT: 45.2 % (ref 39.0–52.0)
HEMOGLOBIN: 16.1 g/dL (ref 13.0–17.0)
MCH: 31.8 pg (ref 26.0–34.0)
MCHC: 35.6 g/dL (ref 30.0–36.0)
MCV: 89.2 fL (ref 78.0–100.0)
Platelets: 143 10*3/uL — ABNORMAL LOW (ref 150–400)
RBC: 5.07 MIL/uL (ref 4.22–5.81)
RDW: 12.5 % (ref 11.5–15.5)
WBC: 8.6 10*3/uL (ref 4.0–10.5)

## 2016-05-15 LAB — CREATININE, SERUM
CREATININE: 1.43 mg/dL — AB (ref 0.61–1.24)
GFR calc Af Amer: >60 mL/min (ref >60)

## 2016-05-15 LAB — HIV ANTIBODY (ROUTINE TESTING W REFLEX): HIV SCREEN 4TH GENERATION: NONREACTIVE

## 2016-05-15 SURGERY — LAPAROSCOPIC CHOLECYSTECTOMY WITH INTRAOPERATIVE CHOLANGIOGRAM
Anesthesia: General

## 2016-05-15 MED ORDER — ONDANSETRON HCL 4 MG/2ML IJ SOLN
INTRAMUSCULAR | Status: DC | PRN
  Administered 2016-05-15: 4 mg via INTRAVENOUS

## 2016-05-15 MED ORDER — SCOPOLAMINE 1 MG/3DAYS TD PT72
MEDICATED_PATCH | TRANSDERMAL | Status: AC
  Filled 2016-05-15: qty 1

## 2016-05-15 MED ORDER — LAMOTRIGINE 100 MG PO TABS
200.0000 mg | ORAL_TABLET | Freq: Every day | ORAL | Status: DC
  Administered 2016-05-15: 200 mg via ORAL
  Filled 2016-05-15: qty 2

## 2016-05-15 MED ORDER — SUGAMMADEX SODIUM 500 MG/5ML IV SOLN
INTRAVENOUS | Status: DC | PRN
  Administered 2016-05-15: 250 mg via INTRAVENOUS

## 2016-05-15 MED ORDER — PROMETHAZINE HCL 25 MG/ML IJ SOLN: 6.2500 mg | INTRAMUSCULAR | Status: DC | PRN

## 2016-05-15 MED ORDER — MIDAZOLAM HCL 2 MG/2ML IJ SOLN: 0.5000 mg | Freq: Once | INTRAMUSCULAR | Status: DC | PRN

## 2016-05-15 MED ORDER — DEXAMETHASONE SODIUM PHOSPHATE 10 MG/ML IJ SOLN
INTRAMUSCULAR | Status: AC
  Filled 2016-05-15: qty 1

## 2016-05-15 MED ORDER — HYDROMORPHONE HCL 2 MG/ML IJ SOLN
INTRAMUSCULAR | Status: AC
  Filled 2016-05-15: qty 1

## 2016-05-15 MED ORDER — TOPIRAMATE 25 MG PO TABS
25.0000 mg | ORAL_TABLET | Freq: Every day | ORAL | Status: DC
  Administered 2016-05-15: 25 mg via ORAL
  Filled 2016-05-15: qty 1

## 2016-05-15 MED ORDER — PROPOFOL 10 MG/ML IV BOLUS
INTRAVENOUS | Status: DC | PRN
  Administered 2016-05-15: 200 mg via INTRAVENOUS

## 2016-05-15 MED ORDER — SUCCINYLCHOLINE CHLORIDE 20 MG/ML IJ SOLN
INTRAMUSCULAR | Status: DC | PRN
  Administered 2016-05-15: 120 mg via INTRAVENOUS

## 2016-05-15 MED ORDER — MEPERIDINE HCL 50 MG/ML IJ SOLN: 6.2500 mg | INTRAMUSCULAR | Status: DC | PRN

## 2016-05-15 MED ORDER — ROCURONIUM BROMIDE 50 MG/5ML IV SOSY
PREFILLED_SYRINGE | INTRAVENOUS | Status: AC
  Filled 2016-05-15: qty 5

## 2016-05-15 MED ORDER — FENTANYL CITRATE (PF) 250 MCG/5ML IJ SOLN
INTRAMUSCULAR | Status: AC
  Filled 2016-05-15: qty 5

## 2016-05-15 MED ORDER — LACTATED RINGERS IV SOLN
INTRAVENOUS | Status: DC
  Administered 2016-05-15: 1000 mL via INTRAVENOUS
  Administered 2016-05-15: 09:00:00 via INTRAVENOUS

## 2016-05-15 MED ORDER — DIVALPROEX SODIUM ER 500 MG PO TB24
2000.0000 mg | ORAL_TABLET | Freq: Every day | ORAL | Status: DC
  Administered 2016-05-15: 2000 mg via ORAL
  Filled 2016-05-15: qty 4

## 2016-05-15 MED ORDER — ONDANSETRON HCL 4 MG/2ML IJ SOLN: 4.0000 mg | Freq: Four times a day (QID) | INTRAMUSCULAR | Status: DC | PRN

## 2016-05-15 MED ORDER — FENTANYL CITRATE (PF) 100 MCG/2ML IJ SOLN
INTRAMUSCULAR | Status: DC | PRN
  Administered 2016-05-15: 50 ug via INTRAVENOUS
  Administered 2016-05-15: 100 ug via INTRAVENOUS
  Administered 2016-05-15 (×2): 50 ug via INTRAVENOUS

## 2016-05-15 MED ORDER — PANTOPRAZOLE SODIUM 40 MG IV SOLR
40.0000 mg | Freq: Every day | INTRAVENOUS | Status: DC
  Administered 2016-05-15: 40 mg via INTRAVENOUS
  Filled 2016-05-15: qty 40

## 2016-05-15 MED ORDER — HYDROCODONE-ACETAMINOPHEN 5-325 MG PO TABS
1.0000 | ORAL_TABLET | ORAL | Status: DC | PRN
Start: 2016-05-15 — End: 2016-05-16
  Administered 2016-05-15: 2 via ORAL
  Administered 2016-05-16: 1 via ORAL
  Filled 2016-05-15: qty 2
  Filled 2016-05-15: qty 1

## 2016-05-15 MED ORDER — PROPOFOL 10 MG/ML IV BOLUS
INTRAVENOUS | Status: AC
  Filled 2016-05-15: qty 20

## 2016-05-15 MED ORDER — HYDROMORPHONE HCL 1 MG/ML IJ SOLN: 0.2500 mg | INTRAMUSCULAR | Status: DC | PRN

## 2016-05-15 MED ORDER — HYDROMORPHONE HCL 1 MG/ML IJ SOLN
INTRAMUSCULAR | Status: DC | PRN
  Administered 2016-05-15: .4 mg via INTRAVENOUS

## 2016-05-15 MED ORDER — IOPAMIDOL (ISOVUE-300) INJECTION 61%
INTRAVENOUS | Status: AC
  Filled 2016-05-15: qty 50

## 2016-05-15 MED ORDER — SUCCINYLCHOLINE CHLORIDE 200 MG/10ML IV SOSY
PREFILLED_SYRINGE | INTRAVENOUS | Status: AC
  Filled 2016-05-15: qty 10

## 2016-05-15 MED ORDER — PIPERACILLIN-TAZOBACTAM 3.375 G IVPB
INTRAVENOUS | Status: AC
  Filled 2016-05-15: qty 50

## 2016-05-15 MED ORDER — ONDANSETRON HCL 4 MG/2ML IJ SOLN
INTRAMUSCULAR | Status: AC
  Filled 2016-05-15: qty 2

## 2016-05-15 MED ORDER — ONDANSETRON 4 MG PO TBDP: 4.0000 mg | ORAL_TABLET | Freq: Four times a day (QID) | ORAL | Status: DC | PRN

## 2016-05-15 MED ORDER — HEPARIN SODIUM (PORCINE) 5000 UNIT/ML IJ SOLN
5000.0000 [IU] | Freq: Three times a day (TID) | INTRAMUSCULAR | Status: DC
  Administered 2016-05-15 – 2016-05-16 (×2): 5000 [IU] via SUBCUTANEOUS
  Filled 2016-05-15 (×2): qty 1

## 2016-05-15 MED ORDER — LEVOTHYROXINE SODIUM 50 MCG PO TABS
50.0000 ug | ORAL_TABLET | Freq: Every day | ORAL | Status: DC
  Administered 2016-05-16: 50 ug via ORAL
  Filled 2016-05-15: qty 1

## 2016-05-15 MED ORDER — LIDOCAINE HCL (CARDIAC) 20 MG/ML IV SOLN
INTRAVENOUS | Status: DC | PRN
  Administered 2016-05-15: 100 mg via INTRAVENOUS

## 2016-05-15 MED ORDER — SCOPOLAMINE 1 MG/3DAYS TD PT72
MEDICATED_PATCH | TRANSDERMAL | Status: DC | PRN
  Administered 2016-05-15: 1 via TRANSDERMAL

## 2016-05-15 MED ORDER — KCL IN DEXTROSE-NACL 20-5-0.45 MEQ/L-%-% IV SOLN
INTRAVENOUS | Status: DC
  Administered 2016-05-15: via INTRAVENOUS
  Filled 2016-05-15 (×2): qty 1000

## 2016-05-15 MED ORDER — MIDAZOLAM HCL 5 MG/5ML IJ SOLN
INTRAMUSCULAR | Status: DC | PRN
  Administered 2016-05-15: 2 mg via INTRAVENOUS

## 2016-05-15 MED ORDER — MORPHINE SULFATE (PF) 2 MG/ML IV SOLN
1.0000 mg | INTRAVENOUS | Status: DC | PRN
  Administered 2016-05-15 (×2): 1 mg via INTRAVENOUS
  Filled 2016-05-15 (×2): qty 1

## 2016-05-15 MED ORDER — BUPIVACAINE LIPOSOME 1.3 % IJ SUSP
20.0000 mL | Freq: Once | INTRAMUSCULAR | Status: AC
  Administered 2016-05-15: 20 mL
  Filled 2016-05-15: qty 20

## 2016-05-15 MED ORDER — 0.9 % SODIUM CHLORIDE (POUR BTL) OPTIME
TOPICAL | Status: DC | PRN
  Administered 2016-05-15: 1000 mL

## 2016-05-15 MED ORDER — IOPAMIDOL (ISOVUE-300) INJECTION 61%
INTRAVENOUS | Status: DC | PRN
Start: 2016-05-15 — End: 2016-05-15
  Administered 2016-05-15: 5 mL

## 2016-05-15 MED ORDER — DEXAMETHASONE SODIUM PHOSPHATE 10 MG/ML IJ SOLN
INTRAMUSCULAR | Status: DC | PRN
  Administered 2016-05-15: 10 mg via INTRAVENOUS

## 2016-05-15 MED ORDER — LACTATED RINGERS IR SOLN
Status: DC | PRN
  Administered 2016-05-15: 1500 mL

## 2016-05-15 MED ORDER — LIDOCAINE 2% (20 MG/ML) 5 ML SYRINGE
INTRAMUSCULAR | Status: AC
  Filled 2016-05-15: qty 5

## 2016-05-15 MED ORDER — ROCURONIUM BROMIDE 100 MG/10ML IV SOLN
INTRAVENOUS | Status: DC | PRN
  Administered 2016-05-15: 40 mg via INTRAVENOUS
  Administered 2016-05-15 (×2): 10 mg via INTRAVENOUS

## 2016-05-15 MED ORDER — ATORVASTATIN CALCIUM 20 MG PO TABS
20.0000 mg | ORAL_TABLET | Freq: Every day | ORAL | Status: DC
  Administered 2016-05-15: 20 mg via ORAL
  Filled 2016-05-15: qty 1

## 2016-05-15 MED ORDER — MIDAZOLAM HCL 2 MG/2ML IJ SOLN
INTRAMUSCULAR | Status: AC
  Filled 2016-05-15: qty 2

## 2016-05-15 SURGICAL SUPPLY — 38 items
APPLICATOR COTTON TIP 6IN STRL (MISCELLANEOUS) ×4 IMPLANT
APPLIER CLIP ROT 10 11.4 M/L (STAPLE) ×2
BENZOIN TINCTURE PRP APPL 2/3 (GAUZE/BANDAGES/DRESSINGS) IMPLANT
CABLE HIGH FREQUENCY MONO STRZ (ELECTRODE) ×2 IMPLANT
CATH REDDICK CHOLANGI 4FR 50CM (CATHETERS) ×2 IMPLANT
CLIP APPLIE ROT 10 11.4 M/L (STAPLE) ×1 IMPLANT
COVER MAYO STAND STRL (DRAPES) ×2 IMPLANT
COVER SURGICAL LIGHT HANDLE (MISCELLANEOUS) ×2 IMPLANT
DECANTER SPIKE VIAL GLASS SM (MISCELLANEOUS) IMPLANT
DERMABOND ADVANCED (GAUZE/BANDAGES/DRESSINGS) ×1
DERMABOND ADVANCED .7 DNX12 (GAUZE/BANDAGES/DRESSINGS) ×1 IMPLANT
DRAPE C-ARM 42X120 X-RAY (DRAPES) ×2 IMPLANT
ELECT PENCIL ROCKER SW 15FT (MISCELLANEOUS) IMPLANT
ELECT REM PT RETURN 15FT ADLT (MISCELLANEOUS) ×2 IMPLANT
GLOVE BIOGEL M 8.0 STRL (GLOVE) ×2 IMPLANT
GOWN STRL REUS W/TWL XL LVL3 (GOWN DISPOSABLE) ×6 IMPLANT
HEMOSTAT SURGICEL 4X8 (HEMOSTASIS) IMPLANT
IRRIG SUCT STRYKERFLOW 2 WTIP (MISCELLANEOUS) ×2
IRRIGATION SUCT STRKRFLW 2 WTP (MISCELLANEOUS) ×1 IMPLANT
IV CATH 14GX2 1/4 (CATHETERS) ×2 IMPLANT
KIT BASIN OR (CUSTOM PROCEDURE TRAY) ×2 IMPLANT
L-HOOK LAP DISP 36CM (ELECTROSURGICAL)
LHOOK LAP DISP 36CM (ELECTROSURGICAL) IMPLANT
POUCH RETRIEVAL ECOSAC 10 (ENDOMECHANICALS) ×1 IMPLANT
POUCH RETRIEVAL ECOSAC 10MM (ENDOMECHANICALS) ×1
POUCH SPECIMEN RETRIEVAL 10MM (ENDOMECHANICALS) ×2 IMPLANT
SCISSORS LAP 5X45 EPIX DISP (ENDOMECHANICALS) ×2 IMPLANT
SLEEVE XCEL OPT CAN 5 100 (ENDOMECHANICALS) ×4 IMPLANT
STRIP CLOSURE SKIN 1/2X4 (GAUZE/BANDAGES/DRESSINGS) IMPLANT
SUT MNCRL AB 4-0 PS2 18 (SUTURE) ×2 IMPLANT
SUT VIC AB 4-0 SH 18 (SUTURE) ×2 IMPLANT
SYR 20CC LL (SYRINGE) ×2 IMPLANT
TOWEL OR 17X26 10 PK STRL BLUE (TOWEL DISPOSABLE) ×2 IMPLANT
TRAY LAPAROSCOPIC (CUSTOM PROCEDURE TRAY) ×2 IMPLANT
TROCAR BLADELESS OPT 5 100 (ENDOMECHANICALS) ×2 IMPLANT
TROCAR XCEL BLUNT TIP 100MML (ENDOMECHANICALS) IMPLANT
TROCAR XCEL NON-BLD 11X100MML (ENDOMECHANICALS) ×2 IMPLANT
TUBING INSUF HEATED (TUBING) ×2 IMPLANT

## 2016-05-15 NOTE — Interval H&P Note (Signed)
History and Physical Interval Note:  05/15/2016 9:30 AM  Paul Simmons  has presented today for surgery, with the diagnosis of cholecystitis   The various methods of treatment have been discussed with the patient and family. After consideration of risks, benefits and other options for treatment, the patient has consented to  Procedure(s): LAPAROSCOPIC CHOLECYSTECTOMY WITH INTRAOPERATIVE CHOLANGIOGRAM (N/A) as a surgical intervention .  The patient's history has been reviewed, patient examined, no change in status, stable for surgery.  I have reviewed the patient's chart and labs.  Questions were answered to the patient's satisfaction.     Paige Vanderwoude B

## 2016-05-15 NOTE — Anesthesia Procedure Notes (Signed)
Procedure Name: Intubation Date/Time: 05/15/2016 9:50 AM Performed by: Glory Buff Pre-anesthesia Checklist: Patient identified, Emergency Drugs available, Suction available and Patient being monitored Patient Re-evaluated:Patient Re-evaluated prior to inductionOxygen Delivery Method: Circle system utilized Preoxygenation: Pre-oxygenation with 100% oxygen Intubation Type: IV induction Ventilation: Mask ventilation without difficulty Laryngoscope Size: Miller and 3 Grade View: Grade I Tube type: Oral Tube size: 7.5 mm Number of attempts: 1 Airway Equipment and Method: Stylet and Oral airway Placement Confirmation: ETT inserted through vocal cords under direct vision,  positive ETCO2 and breath sounds checked- equal and bilateral Secured at: 21 cm Tube secured with: Tape Dental Injury: Teeth and Oropharynx as per pre-operative assessment

## 2016-05-15 NOTE — Anesthesia Postprocedure Evaluation (Signed)
Anesthesia Post Note  Patient: ARYE WEYENBERG  Procedure(s) Performed: Procedure(s) (LRB): LAPAROSCOPIC CHOLECYSTECTOMY WITH INTRAOPERATIVE CHOLANGIOGRAM (N/A)  Patient location during evaluation: PACU Anesthesia Type: General Level of consciousness: awake and alert, oriented and patient cooperative Pain management: pain level controlled Vital Signs Assessment: post-procedure vital signs reviewed and stable Respiratory status: spontaneous breathing, nonlabored ventilation, respiratory function stable and patient connected to nasal cannula oxygen Cardiovascular status: blood pressure returned to baseline and stable Postop Assessment: no signs of nausea or vomiting Anesthetic complications: no       Last Vitals:  Vitals:   05/15/16 1215 05/15/16 1230  BP: 126/71 127/66  Pulse: 73 71  Resp: 13 13  Temp: 36.4 C     Last Pain:  Vitals:   05/15/16 1230  TempSrc:   PainSc: Asleep                 Brittni Hult,E. Chistine Dematteo

## 2016-05-15 NOTE — Transfer of Care (Signed)
Immediate Anesthesia Transfer of Care Note  Patient: Paul Simmons  Procedure(s) Performed: Procedure(s): LAPAROSCOPIC CHOLECYSTECTOMY WITH INTRAOPERATIVE CHOLANGIOGRAM (N/A)  Patient Location: PACU  Anesthesia Type:General  Level of Consciousness: awake, alert  and oriented  Airway & Oxygen Therapy: Patient Spontanous Breathing and Patient connected to face mask oxygen  Post-op Assessment: Report given to RN and Post -op Vital signs reviewed and stable  Post vital signs: Reviewed and stable  Last Vitals:  Vitals:   05/14/16 2114 05/15/16 0638  BP: 103/61 101/71  Pulse: 67 79  Resp: 18 18  Temp: 36.6 C 36.5 C    Last Pain:  Vitals:   05/15/16 0638  TempSrc: Oral  PainSc:       Patients Stated Pain Goal: 2 (81/10/31 [id number])  Complications: No apparent anesthesia complications

## 2016-05-15 NOTE — Anesthesia Preprocedure Evaluation (Addendum)
Anesthesia Evaluation  Patient identified by MRN, date of birth, ID band Patient awake    Reviewed: Allergy & Precautions, NPO status , Patient's Chart, lab work & pertinent test results  History of Anesthesia Complications Negative for: history of anesthetic complications  Airway Mallampati: III  TM Distance: >3 FB Neck ROM: Full    Dental  (+) Dental Advisory Given   Pulmonary sleep apnea (does not use his CPAP) ,    breath sounds clear to auscultation       Cardiovascular negative cardio ROS   Rhythm:Regular Rate:Normal     Neuro/Psych Bipolar Disorder    GI/Hepatic Neg liver ROS, GERD  Poorly Controlled,n/v with cholelithiasis   Endo/Other  Hypothyroidism Morbid obesity  Renal/GU negative Renal ROS     Musculoskeletal   Abdominal (+) + obese,   Peds  Hematology negative hematology ROS (+)   Anesthesia Other Findings   Reproductive/Obstetrics                            Anesthesia Physical Anesthesia Plan  ASA: III  Anesthesia Plan: General   Post-op Pain Management:    Induction: Intravenous and Rapid sequence  Airway Management Planned: Oral ETT  Additional Equipment:   Intra-op Plan:   Post-operative Plan: Extubation in OR  Informed Consent: I have reviewed the patients History and Physical, chart, labs and discussed the procedure including the risks, benefits and alternatives for the proposed anesthesia with the patient or authorized representative who has indicated his/her understanding and acceptance.   Dental advisory given  Plan Discussed with: CRNA and Surgeon  Anesthesia Plan Comments: (Plan routine monitors, GETA)        Anesthesia Quick Evaluation

## 2016-05-15 NOTE — Progress Notes (Signed)
Brief Pharmacy note: Phentermine  Anorexiants are not continued during hospitalization, per P&T guidelines.  Phentermine has been discontinued.  Thank you,   Minda Ditto PharmD Pager [phone] 05/15/2016, 7:27 AM

## 2016-05-15 NOTE — Op Note (Signed)
Paul Simmons   05/15/2016  11:19 AM  Procedure: Laparoscopic Cholecystectomy with intraoperative cholangiogram  Surgeon: Catalina Antigua B. Hassell Done, MD, FACS Asst:  none  Anes:  General  Drains:  None  Findings: Chronic cholecystitis; normal IOC  Description of Procedure: The patient was taken to OR 2 and given general anesthesia.  The patient was prepped with Technicare and draped sterilely. A time out was performed.  Access to the abdomen was achieved with a 5 mm Optiview through the right upper quadrant.  Port placement included three 5 mm trocars and an 11 in the upper midline.    The gallbladder was visualized and the fundus was grasped and the gallbladder was elevated. Traction on the infundibulum allowed for successful demonstration of the critical view. Inflammatory changes were chronic with adhesions to the body and fundus of the gallbladder.  The cystic duct was identified and clipped up on the gallbladder and an incision was made in the cystic duct and the Reddick catheter was inserted after milking the cystic duct of any debris. A dynamic cholangiogram was performed which demonstrated a tortuous cystic duct, intrahepatic filling and flow into the duodenum.  No stones were seen.    The cystic duct was then triple clipped and divided, the cystic artery was double clipped and divided and then the gallbladder was removed from the gallbladder bed. Removal of the gallbladder from the gallbladder bed was straight forward with mild edema in the wall.  There was an opening that drained some bile but no stones were noted.  The gallbladder was then placed in a bag and brought out through one of the trocar sites. The gallbladder bed was inspected and no bleeding or bile leaks were seen.  The bed was irrigated with 2 liters of saline.    Laparoscopic visualization was used when injecting Exparel.   Incisions were injected with Exparel and closed with 4-0 Monocryl and Dermabond on the skin.  Sponge and needle  count were correct.    The patient was taken to the recovery room in satisfactory condition.

## 2016-05-16 DIAGNOSIS — Z9049 Acquired absence of other specified parts of digestive tract: Secondary | ICD-10-CM

## 2016-05-16 LAB — COMPREHENSIVE METABOLIC PANEL
ALBUMIN: 3.8 g/dL (ref 3.5–5.0)
ALK PHOS: 52 U/L (ref 38–126)
ALT: 55 U/L (ref 17–63)
AST: 39 U/L (ref 15–41)
Anion gap: 8 (ref 5–15)
BILIRUBIN TOTAL: 0.6 mg/dL (ref 0.3–1.2)
BUN: 8 mg/dL (ref 6–20)
CO2: 26 mmol/L (ref 22–32)
CREATININE: 1.17 mg/dL (ref 0.61–1.24)
Calcium: 9 mg/dL (ref 8.9–10.3)
Chloride: 107 mmol/L (ref 101–111)
GFR calc Af Amer: >60 mL/min (ref >60)
GFR calc non Af Amer: >60 mL/min (ref >60)
Glucose, Bld: 112 mg/dL — ABNORMAL HIGH (ref 65–99)
Potassium: 3.7 mmol/L (ref 3.5–5.1)
SODIUM: 141 mmol/L (ref 135–145)
Total Protein: 6.6 g/dL (ref 6.5–8.1)

## 2016-05-16 LAB — CBC
HCT: 39.8 % (ref 39.0–52.0)
Hemoglobin: 14.2 g/dL (ref 13.0–17.0)
MCH: 30.9 pg (ref 26.0–34.0)
MCHC: 35.7 g/dL (ref 30.0–36.0)
MCV: 86.7 fL (ref 78.0–100.0)
PLATELETS: 164 10*3/uL (ref 150–400)
RBC: 4.59 MIL/uL (ref 4.22–5.81)
RDW: 12.3 % (ref 11.5–15.5)
WBC: 9.2 10*3/uL (ref 4.0–10.5)

## 2016-05-16 MED ORDER — HYDROCODONE-ACETAMINOPHEN 5-325 MG PO TABS: 1.0000 | ORAL_TABLET | ORAL | 0 refills | Status: DC | PRN

## 2016-05-16 NOTE — Discharge Instructions (Signed)
Laparoscopic Cholecystectomy Laparoscopic cholecystectomy is surgery to remove the gallbladder. The gallbladder is a pear-shaped organ that lies beneath the liver on the right side of the body. The gallbladder stores bile, which is a fluid that helps the body to digest fats. Cholecystectomy is often done for inflammation of the gallbladder (cholecystitis). This condition is usually caused by a buildup of gallstones (cholelithiasis) in the gallbladder. Gallstones can block the flow of bile, which can result in inflammation and pain. In severe cases, emergency surgery may be required. This procedure is done though small incisions in your abdomen (laparoscopic surgery). A thin scope with a camera (laparoscope) is inserted through one incision. Thin surgical instruments are inserted through the other incisions. In some cases, a laparoscopic procedure may be turned into a type of surgery that is done through a larger incision (open surgery). Tell a health care provider about:  Any allergies you have.  All medicines you are taking, including vitamins, herbs, eye drops, creams, and over-the-counter medicines.  Any problems you or family members have had with anesthetic medicines.  Any blood disorders you have.  Any surgeries you have had.  Any medical conditions you have.  Whether you are pregnant or may be pregnant. What are the risks? Generally, this is a safe procedure. However, problems may occur, including:  Infection.  Bleeding.  Allergic reactions to medicines.  Damage to other structures or organs.  A stone remaining in the common bile duct. The common bile duct carries bile from the gallbladder into the small intestine.  A bile leak from the cyst duct that is clipped when your gallbladder is removed. What happens before the procedure? Staying hydrated  Follow instructions from your health care provider about hydration, which may include:  Up to 2 hours before the procedure - you  may continue to drink clear liquids, such as water, clear fruit juice, black coffee, and plain tea. Eating and drinking restrictions  Follow instructions from your health care provider about eating and drinking, which may include:  8 hours before the procedure - stop eating heavy meals or foods such as meat, fried foods, or fatty foods.  6 hours before the procedure - stop eating light meals or foods, such as toast or cereal.  6 hours before the procedure - stop drinking milk or drinks that contain milk.  2 hours before the procedure - stop drinking clear liquids. Medicines   Ask your health care provider about:  Changing or stopping your regular medicines. This is especially important if you are taking diabetes medicines or blood thinners.  Taking medicines such as aspirin and ibuprofen. These medicines can thin your blood. Do not take these medicines before your procedure if your health care provider instructs you not to.  You may be given antibiotic medicine to help prevent infection. General instructions   Let your health care provider know if you develop a cold or an infection before surgery.  Plan to have someone take you home from the hospital or clinic.  Ask your health care provider how your surgical site will be marked or identified. What happens during the procedure?  To reduce your risk of infection:  Your health care team will wash or sanitize their hands.  Your skin will be washed with soap.  Hair may be removed from the surgical area.  An IV tube may be inserted into one of your veins.  You will be given one or more of the following:  A medicine to help you relax (  sedative).  A medicine to make you fall asleep (general anesthetic).  A breathing tube will be placed in your mouth.  Your surgeon will make several small cuts (incisions) in your abdomen.  The laparoscope will be inserted through one of the small incisions. The camera on the laparoscope will  send images to a TV screen (monitor) in the operating room. This lets your surgeon see inside your abdomen.  Air-like gas will be pumped into your abdomen. This will expand your abdomen to give the surgeon more room to perform the surgery.  Other tools that are needed for the procedure will be inserted through the other incisions. The gallbladder will be removed through one of the incisions.  Your common bile duct may be examined. If stones are found in the common bile duct, they may be removed.  After your gallbladder has been removed, the incisions will be closed with stitches (sutures), staples, or skin glue.  Your incisions may be covered with a bandage (dressing). The procedure may vary among health care providers and hospitals. What happens after the procedure?  Your blood pressure, heart rate, breathing rate, and blood oxygen level will be monitored until the medicines you were given have worn off.  You will be given medicines as needed to control your pain.  Do not drive for 24 hours if you were given a sedative. This information is not intended to replace advice given to you by your health care provider. Make sure you discuss any questions you have with your health care provider. Document Released: 01/27/2005 Document Revised: 08/19/2015 Document Reviewed: 07/16/2015 [employer] Interactive Patient Education  2017 [employer]  

## 2016-05-16 NOTE — Discharge Summary (Signed)
Physician Discharge Summary  Patient ID: Paul Simmons MRN: [id number] DOB/AGE: Jan 09, 1981 36 y.o.  Admit date: 05/14/2016 Discharge date: 05/16/2016  Admission Diagnoses:  cholecystitis  Discharge Diagnoses:  same  Principal Problem:   Chronic cholecystitis with calculus Active Problems:   Bipolar disorder ([place])   PTSD (post-traumatic stress disorder)   Hypothyroidism   OSA (obstructive sleep apnea)   S/P laparoscopic cholecystectomy April 2018   Surgery:  Laparoscopic cholecystectomy with IOC   Discharged Condition: improved  Hospital Course:   Admitted from ultrasound radiology.  Had surgery next day.  Did well and ready to go home on PD 1  Consults: none  Significant Diagnostic Studies: IOC-normal    Discharge Exam: Blood pressure 121/66, pulse (!) 58, temperature 97.7 F (36.5 C), temperature source Oral, resp. rate 16, height 6' (1.829 m), weight 117.9 kg (260 lb), SpO2 99 %. Incisions with Dermabond and look good  Disposition:   Discharge Instructions    Call MD for:  difficulty breathing, headache or visual disturbances    Complete by:  As directed    Call MD for:  persistant nausea and vomiting    Complete by:  As directed    Call MD for:  redness, tenderness, or signs of infection (pain, swelling, redness, odor or green/yellow discharge around incision site)    Complete by:  As directed    Call MD for:  severe uncontrolled pain    Complete by:  As directed    Diet - low sodium heart healthy    Complete by:  As directed    Increase activity slowly    Complete by:  As directed    No wound care    Complete by:  As directed      Allergies as of 05/16/2016   No Known Allergies     Medication List    TAKE these medications   atorvastatin 20 MG tablet Commonly known as:  LIPITOR Take 20 mg by mouth at bedtime.   divalproex 500 MG 24 hr tablet Commonly known as:  DEPAKOTE ER Take 2,000 mg by mouth at bedtime.   HYDROcodone-acetaminophen 5-325 MG  tablet Commonly known as:  NORCO/VICODIN Take 1-2 tablets by mouth every 4 (four) hours as needed for moderate pain.   lamoTRIgine 100 MG tablet Commonly known as:  LAMICTAL Take 200 mg by mouth at bedtime.   levothyroxine 50 MCG tablet Commonly known as:  SYNTHROID, LEVOTHROID Take 50 mcg by mouth daily before breakfast.   phentermine 37.5 MG capsule Take 37.5 mg by mouth every morning.   topiramate 25 MG tablet Commonly known as:  TOPAMAX Take 25 mg by mouth at bedtime.      Follow-up Information    Sylina Henion B, MD. Schedule an appointment as soon as possible for a visit in 4 week(s).   Specialty:  General Surgery Contact information: [address] [place] [address] [phone]           Signed: Pedro Earls 05/16/2016, 11:19 AM

## 2016-06-09 DIAGNOSIS — E038 Other specified hypothyroidism: Secondary | ICD-10-CM | POA: Diagnosis not present

## 2016-06-09 DIAGNOSIS — J018 Other acute sinusitis: Secondary | ICD-10-CM | POA: Diagnosis not present

## 2016-06-09 DIAGNOSIS — E784 Other hyperlipidemia: Secondary | ICD-10-CM | POA: Diagnosis not present

## 2016-07-17 DIAGNOSIS — E784 Other hyperlipidemia: Secondary | ICD-10-CM | POA: Diagnosis not present

## 2016-07-17 DIAGNOSIS — E038 Other specified hypothyroidism: Secondary | ICD-10-CM | POA: Diagnosis not present

## 2016-09-05 DIAGNOSIS — E784 Other hyperlipidemia: Secondary | ICD-10-CM | POA: Diagnosis not present

## 2016-09-05 DIAGNOSIS — E038 Other specified hypothyroidism: Secondary | ICD-10-CM | POA: Diagnosis not present

## 2016-09-05 DIAGNOSIS — F310 Bipolar disorder, current episode hypomanic: Secondary | ICD-10-CM | POA: Diagnosis not present

## 2016-12-24 DIAGNOSIS — M779 Enthesopathy, unspecified: Secondary | ICD-10-CM | POA: Diagnosis not present

## 2016-12-24 DIAGNOSIS — G5781 Other specified mononeuropathies of right lower limb: Secondary | ICD-10-CM | POA: Diagnosis not present

## 2017-02-06 DIAGNOSIS — E038 Other specified hypothyroidism: Secondary | ICD-10-CM | POA: Diagnosis not present

## 2017-02-06 DIAGNOSIS — F310 Bipolar disorder, current episode hypomanic: Secondary | ICD-10-CM | POA: Diagnosis not present

## 2017-02-06 DIAGNOSIS — E7849 Other hyperlipidemia: Secondary | ICD-10-CM | POA: Diagnosis not present

## 2017-03-10 DIAGNOSIS — L03119 Cellulitis of unspecified part of limb: Secondary | ICD-10-CM | POA: Diagnosis not present

## 2017-03-10 DIAGNOSIS — Z23 Encounter for immunization: Secondary | ICD-10-CM | POA: Diagnosis not present

## 2017-03-10 DIAGNOSIS — A35 Other tetanus: Secondary | ICD-10-CM | POA: Diagnosis not present

## 2017-03-10 DIAGNOSIS — S80812A Abrasion, left lower leg, initial encounter: Secondary | ICD-10-CM | POA: Diagnosis not present

## 2017-03-10 DIAGNOSIS — T148XXA Other injury of unspecified body region, initial encounter: Secondary | ICD-10-CM | POA: Diagnosis not present

## 2017-03-10 DIAGNOSIS — S81802A Unspecified open wound, left lower leg, initial encounter: Secondary | ICD-10-CM | POA: Diagnosis not present

## 2017-03-23 DIAGNOSIS — Z23 Encounter for immunization: Secondary | ICD-10-CM | POA: Diagnosis not present

## 2017-06-17 DIAGNOSIS — J029 Acute pharyngitis, unspecified: Secondary | ICD-10-CM | POA: Diagnosis not present

## 2017-06-23 DIAGNOSIS — E038 Other specified hypothyroidism: Secondary | ICD-10-CM | POA: Diagnosis not present

## 2017-06-23 DIAGNOSIS — E7849 Other hyperlipidemia: Secondary | ICD-10-CM | POA: Diagnosis not present

## 2017-06-24 DIAGNOSIS — J029 Acute pharyngitis, unspecified: Secondary | ICD-10-CM | POA: Diagnosis not present

## 2017-07-10 DIAGNOSIS — K219 Gastro-esophageal reflux disease without esophagitis: Secondary | ICD-10-CM | POA: Diagnosis not present

## 2017-07-10 DIAGNOSIS — J300 Vasomotor rhinitis: Secondary | ICD-10-CM | POA: Diagnosis not present

## 2017-09-16 DIAGNOSIS — E7849 Other hyperlipidemia: Secondary | ICD-10-CM | POA: Diagnosis not present

## 2017-09-16 DIAGNOSIS — E038 Other specified hypothyroidism: Secondary | ICD-10-CM | POA: Diagnosis not present

## 2017-09-18 DIAGNOSIS — J029 Acute pharyngitis, unspecified: Secondary | ICD-10-CM | POA: Diagnosis not present

## 2017-10-13 DIAGNOSIS — F3175 Bipolar disorder, in partial remission, most recent episode depressed: Secondary | ICD-10-CM | POA: Diagnosis not present

## 2018-01-13 ENCOUNTER — Other Ambulatory Visit: Payer: Self-pay

## 2018-01-13 MED ORDER — ALPRAZOLAM 1 MG PO TABS: ORAL_TABLET | ORAL | 1 refills | Status: DC

## 2018-01-22 ENCOUNTER — Encounter: Payer: Self-pay | Admitting: Emergency Medicine

## 2018-01-22 DIAGNOSIS — F3173 Bipolar disorder, in partial remission, most recent episode manic: Secondary | ICD-10-CM

## 2018-01-22 DIAGNOSIS — F902 Attention-deficit hyperactivity disorder, combined type: Secondary | ICD-10-CM | POA: Insufficient documentation

## 2018-01-22 DIAGNOSIS — F3110 Bipolar disorder, current episode manic without psychotic features, unspecified: Secondary | ICD-10-CM

## 2018-01-22 HISTORY — DX: Bipolar disorder, current episode manic without psychotic features, unspecified: F31.10

## 2018-01-25 ENCOUNTER — Telehealth: Payer: Self-pay | Admitting: Psychiatry

## 2018-01-25 DIAGNOSIS — F4312 Post-traumatic stress disorder, chronic: Secondary | ICD-10-CM

## 2018-01-25 MED ORDER — ALPRAZOLAM 1 MG PO TABS: 0.5000 mg | ORAL_TABLET | Freq: Two times a day (BID) | ORAL | 0 refills | Status: DC | PRN

## 2018-01-25 NOTE — Telephone Encounter (Signed)
Medication refill request presently on by pharmacy apparently to have on file for holiday though he has appointment in the office 02/01/2018.  The alprazolam 1 mg tablet turn Ativan 0.5 mg out of stock is ordered as 1/2 tablet twice daily as needed for anxiety for PTSD #30 no refill same as fill on 01/13/2018 per [place] registry of controlled substances patient using small amounts monitored by mother for major problems.

## 2018-02-01 ENCOUNTER — Encounter: Payer: Self-pay | Admitting: Psychiatry

## 2018-02-01 ENCOUNTER — Ambulatory Visit: Payer: Self-pay | Admitting: Psychiatry

## 2018-02-01 VITALS — BP 112/82 | HR 68 | Ht 72.0 in | Wt 281.0 lb

## 2018-02-01 DIAGNOSIS — Z0000 Encounter for general adult medical examination without abnormal findings: Secondary | ICD-10-CM | POA: Diagnosis not present

## 2018-02-01 DIAGNOSIS — F902 Attention-deficit hyperactivity disorder, combined type: Secondary | ICD-10-CM

## 2018-02-01 DIAGNOSIS — F3173 Bipolar disorder, in partial remission, most recent episode manic: Secondary | ICD-10-CM

## 2018-02-01 DIAGNOSIS — Z1389 Encounter for screening for other disorder: Secondary | ICD-10-CM | POA: Diagnosis not present

## 2018-02-01 DIAGNOSIS — J029 Acute pharyngitis, unspecified: Secondary | ICD-10-CM | POA: Diagnosis not present

## 2018-02-01 DIAGNOSIS — F4310 Post-traumatic stress disorder, unspecified: Secondary | ICD-10-CM

## 2018-02-01 DIAGNOSIS — F4312 Post-traumatic stress disorder, chronic: Secondary | ICD-10-CM

## 2018-02-01 DIAGNOSIS — E7849 Other hyperlipidemia: Secondary | ICD-10-CM | POA: Diagnosis not present

## 2018-02-01 DIAGNOSIS — E039 Hypothyroidism, unspecified: Secondary | ICD-10-CM | POA: Diagnosis not present

## 2018-02-01 DIAGNOSIS — N182 Chronic kidney disease, stage 2 (mild): Secondary | ICD-10-CM | POA: Diagnosis not present

## 2018-02-01 MED ORDER — DIVALPROEX SODIUM ER 500 MG PO TB24: 2000.0000 mg | ORAL_TABLET | Freq: Every day | ORAL | 3 refills | Status: DC

## 2018-02-01 MED ORDER — LAMOTRIGINE 100 MG PO TABS: 300.0000 mg | ORAL_TABLET | Freq: Every day | ORAL | 3 refills | Status: DC

## 2018-02-01 MED ORDER — ALPRAZOLAM 1 MG PO TABS: 0.5000 mg | ORAL_TABLET | Freq: Two times a day (BID) | ORAL | 3 refills | Status: DC | PRN

## 2018-02-01 NOTE — Progress Notes (Signed)
[facility]  Patient ID: Paul Simmons,  MRN: [id number]  PCP: Neale Burly, MD  Date of Evaluation: 02/01/2018 Time spent:20 minutes  Chief Complaint:  Chief Complaint    Anxiety; ADHD; Depression; Manic Behavior      HISTORY/CURRENT STATUS: Paul Simmons is seen individually face-to-face with consent not collateral for psychiatric interview and exam in 37-month evaluation and management of PTSD, bipolar, ADHD and sleep apnea.  He is most concerned today about having pre-panic affective storm in a restaurant with his wife and her relatives when he thought they were all having a good time as he has been taking her to work out of town and hanging out with her.  Last visit he expected divorce, and now sees no stress in the relationship starting and stopping at her wish.  He ask if he had a panic attack in the restaurant as going outside in the cold air and taking his Xanax helped him.  The mixture of affective rageful thoughts and feelings and posture as well as his intense anxiety for other persons and the situation suggest PTSD.  The pharmacist has had difficulty getting the 150 mg Lamictal and he asked for the 100 be written as a total of 300 mg nightly.  He does take his Depakote and his Xanax as needed though he is very sparing with the Xanax.  Anxiety  Presents for follow-up visit. Symptoms include confusion, decreased concentration, dry mouth, excessive worry, irritability, nervous/anxious behavior and panic. Patient reports no compulsions, insomnia, obsessions, restlessness or suicidal ideas. Symptoms occur most days. The severity of symptoms is moderate. The quality of sleep is fair.   His past medical history is significant for depression and suicide attempts. Compliance with medications is 51-75%.  Depression       The patient presents with depression.  The problem occurs every several days.  Associated symptoms include decreased concentration, fatigue, hopelessness and sad.   Associated symptoms include does not have insomnia, no restlessness, no decreased interest, no appetite change and no suicidal ideas.     The symptoms are aggravated by work stress, medication, social issues and family issues.  Past treatments include other medications.  Compliance with treatment is variable.  Past compliance problems include difficulty with treatment plan, medication issues, medical issues and difficulty understanding directions.  Previous treatment provided moderate relief.  Risk factors include family history, prior traumatic experience, stress, emotional abuse, family history of mental illness, history of self-injury and major life event.   Past medical history includes anxiety, bipolar disorder, depression, mental health disorder, post-traumatic stress disorder, schizophrenia, suicide attempts and head trauma.     Pertinent negatives include no physical disability, no recent psychiatric admission and no obsessive-compulsive disorder.   Individual Medical History/ Review of Systems: Changes? :Yes   Allergies: Patient has no known allergies.  Current Medications:  Current Outpatient Medications:  .  ALPRAZolam (XANAX) 0.5 MG tablet, Take 0.5 mg by mouth 2 (two) times daily as needed for anxiety., Disp: , Rfl:  .  ALPRAZolam (XANAX) 1 MG tablet, Take 0.5 tablets (0.5 mg total) by mouth 2 (two) times daily as needed for anxiety., Disp: 30 tablet, Rfl: 3 .  atorvastatin (LIPITOR) 20 MG tablet, Take 20 mg by mouth at bedtime., Disp: , Rfl:  .  divalproex (DEPAKOTE ER) 500 MG 24 hr tablet, Take 4 tablets (2,000 mg total) by mouth at bedtime., Disp: 120 tablet, Rfl: 3 .  HYDROcodone-acetaminophen (NORCO/VICODIN) 5-325 MG tablet, Take 1-2 tablets by mouth every  4 (four) hours as needed for moderate pain., Disp: 30 tablet, Rfl: 0 .  lamoTRIgine (LAMICTAL) 100 MG tablet, Take 3 tablets (300 mg total) by mouth at bedtime., Disp: 90 tablet, Rfl: 3 .  lamoTRIgine (LAMICTAL) 150 MG tablet,  Take 150 mg by mouth at bedtime., Disp: , Rfl:  .  levothyroxine (SYNTHROID, LEVOTHROID) 50 MCG tablet, Take 50 mcg by mouth daily before breakfast., Disp: , Rfl:  .  phentermine 37.5 MG capsule, Take 37.5 mg by mouth every morning., Disp: , Rfl:  .  topiramate (TOPAMAX) 25 MG tablet, Take 25 mg by mouth at bedtime., Disp: , Rfl:  Medication Side Effects: none  Family Medical/ Social History: Changes? Yes expect patient for divorce from wife is now changed to providing her transportation and spending time with her in the process.  MENTAL HEALTH EXAM: Muscle Strength 5/5, AIMS equals 0, postural reflexes 0/0 Blood pressure 112/82, pulse 68, height 6' (1.829 m), weight 281 lb (127.5 kg).Body mass index is 38.11 kg/m.  General Appearance: Casual, Fairly Groomed, Guarded and Obese  Eye Contact:  Fair  Speech:  Clear and Coherent  Volume:  Increased  Mood:  Anxious, Dysphoric, Euphoric and Irritable  Affect:  Depressed, Inappropriate, Labile and Anxious  Thought Process:  Goal Directed and Linear  Orientation:  Full (Time, Place, and Person)  Thought Content: Obsessions and Rumination   Suicidal Thoughts:  No  Homicidal Thoughts:  No  Memory:  Immediate;   Good Remote;   Good  Judgement:  Fair  Insight:  Fair  Psychomotor Activity:  Increased and Mannerisms  Concentration:  Concentration: Fair and Attention Span: Poor  Recall:  Good  Fund of Knowledge: NA  Language: Fair  Assets:  Leisure Time  ADL's:  Intact  Cognition: WNL  Prognosis:  Fair    DIAGNOSES:    ICD-10-CM   1. PTSD (post-traumatic stress disorder) F43.10 divalproex (DEPAKOTE ER) 500 MG 24 hr tablet  2. 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 Attention deficit hyperactivity disorder, combined type F90.2   4. Chronic post-traumatic stress disorder F43.12 lamoTRIgine (LAMICTAL) 100 MG tablet    ALPRAZolam  (XANAX) 1 MG tablet    Receiving Psychotherapy: No  Lanetta Inch, [facility] in this office when patient willing   RECOMMENDATIONS: He is educated on all issues as prescriptions are adjusted as needed for pharmacy.  He is escribed Lamictal 100 mg taking 3 tablets total 300 mg every bedtime quantity #90 with 2 refills for bipolar and PTSD.  He continues Depakote 500 mg ER take 4 tablets nightly #120 with 2 [facility] for [facility] and [facility] [place]  He is escribed Xanax 1 mg tablet taking 1/2 tablet twice daily as needed for anxiety or panic #30 with 2 refills to return in 3 months with Xanax for PTSD.  He is educated on interventions with ex wife and need for therapy.   Delight Hoh, MD

## 2018-03-04 DIAGNOSIS — E039 Hypothyroidism, unspecified: Secondary | ICD-10-CM | POA: Diagnosis not present

## 2018-03-04 DIAGNOSIS — E7849 Other hyperlipidemia: Secondary | ICD-10-CM | POA: Diagnosis not present

## 2018-03-04 DIAGNOSIS — N182 Chronic kidney disease, stage 2 (mild): Secondary | ICD-10-CM | POA: Diagnosis not present

## 2018-03-25 DIAGNOSIS — J028 Acute pharyngitis due to other specified organisms: Secondary | ICD-10-CM | POA: Diagnosis not present

## 2018-03-26 ENCOUNTER — Other Ambulatory Visit: Payer: Self-pay

## 2018-03-26 MED ORDER — LAMOTRIGINE 150 MG PO TABS: 150.0000 mg | ORAL_TABLET | Freq: Every day | ORAL | 1 refills | Status: DC

## 2018-05-06 ENCOUNTER — Ambulatory Visit (INDEPENDENT_AMBULATORY_CARE_PROVIDER_SITE_OTHER): Payer: Self-pay | Admitting: Psychiatry

## 2018-05-06 ENCOUNTER — Encounter: Payer: Self-pay | Admitting: Psychiatry

## 2018-05-06 ENCOUNTER — Other Ambulatory Visit: Payer: Self-pay

## 2018-05-06 DIAGNOSIS — F902 Attention-deficit hyperactivity disorder, combined type: Secondary | ICD-10-CM

## 2018-05-06 DIAGNOSIS — F4312 Post-traumatic stress disorder, chronic: Secondary | ICD-10-CM

## 2018-05-06 DIAGNOSIS — G4733 Obstructive sleep apnea (adult) (pediatric): Secondary | ICD-10-CM

## 2018-05-06 DIAGNOSIS — F3173 Bipolar disorder, in partial remission, most recent episode manic: Secondary | ICD-10-CM

## 2018-05-06 MED ORDER — LAMOTRIGINE 150 MG PO TABS: 300.0000 mg | ORAL_TABLET | Freq: Every day | ORAL | 3 refills | Status: DC

## 2018-05-06 MED ORDER — DIVALPROEX SODIUM ER 500 MG PO TB24: 2000.0000 mg | ORAL_TABLET | Freq: Every day | ORAL | 3 refills | Status: DC

## 2018-05-06 MED ORDER — ALPRAZOLAM 1 MG PO TABS: 0.5000 mg | ORAL_TABLET | Freq: Two times a day (BID) | ORAL | 3 refills | Status: DC | PRN

## 2018-05-06 NOTE — Progress Notes (Signed)
[facility]  Patient ID: Paul Simmons,  MRN: [id number]  PCP: Neale Burly, MD  Date of Evaluation: 05/06/2018 Time spent:15 minutes from 1400 to 1415 I connected with patient by a video enabled telemedicine application or telephone, with their informed consent, and verified patient privacy and that I am speaking with the correct person using two identifiers.  I was located at [place] office and patient at residence with mother in [place].  Chief Complaint:  Chief Complaint    Anxiety; Trauma; Manic Behavior; Depression; ADHD      HISTORY/CURRENT STATUS: [name] is provided telemedicine audio appointment as he declines video having [place] associated PTSD with consent not collateral for psychiatric interview and exam in 82-month evaluation and management of PTSD, bipolar, ADHD, and sleep apnea.  Despite full clarification and disclosure, he has only fair comfort and effort in the session but wishes to proceed.  He suggests the pharmacist prefers the 150 mg Lamictal now over the 100 mg, suggesting the opposite at last appointment.  Otherwise he is compliant with Depakote, Lamictal, and limited Xanax which mother oversees in review as he lives there half time and half time with wife not divorcing.  Wife is now working and living in [place] by his report so that he travels back and forth but denies employment himself.  He notes continued episodic weight gain for whixh he is taking from PCP Topamax 25 mg nightly and phentermine as Adipex 37.5 mg the morning.  He has had no further dissociative panic in the interim as he experienced with wife's family at a restaurant before last appointment, describing fugue then he cannot further describe or clarify now likely PTSD in origin.  He has no suicidality, psychosis, mania or delirium in the interim.  He has no further psychotherapy with Lanetta Inch, LPC since 07/20/2017 in this office.  No concern for misuse of Xanax is evident or other  substance use currently.  Depression       The patient presents with depression.  This is a recurrent problem.  The current episode started more than 1 year ago.   The onset quality is sudden.   The problem occurs intermittently.  The problem has been gradually improving since onset.  Associated symptoms include decreased concentration, insomnia, irritable, decreased interest, appetite change and sad.  Associated symptoms include no fatigue, no helplessness, no hopelessness, no restlessness, no body aches, no headaches, no indigestion and no suicidal ideas.     The symptoms are aggravated by social issues and family issues.  Past treatments include other medications, psychotherapy and SNRIs - Serotonin and norepinephrine reuptake inhibitors.  Compliance with treatment is good.  Past compliance problems include difficulty with treatment plan and medication issues.  Previous treatment provided moderate relief.  Risk factors include prior traumatic experience, stress, major life event, history of self-injury, family history of mental illness, history of mental illness, family history and emotional abuse.   Past medical history includes anxiety, bipolar disorder, depression, mental health disorder and post-traumatic stress disorder.     Pertinent negatives include no recent illness, no life-threatening condition, no physical disability, no recent psychiatric admission, no eating disorder, no obsessive-compulsive disorder, no schizophrenia, no suicide attempts and no head trauma.   Individual Medical History/ Review of Systems: Changes? :No With no interim cholangitis, seizure, or asthma.  Allergies: Patient has no known allergies.  Current Medications:  Current Outpatient Medications:  .  ALPRAZolam (XANAX) 0.5 MG tablet, Take 0.5 mg by mouth 2 (two) times  daily as needed for anxiety., Disp: , Rfl:  .  ALPRAZolam (XANAX) 1 MG tablet, Take 0.5 tablets (0.5 mg total) by mouth 2 (two) times daily as needed for  anxiety., Disp: 30 tablet, Rfl: 3 .  atorvastatin (LIPITOR) 20 MG tablet, Take 20 mg by mouth at bedtime., Disp: , Rfl:  .  divalproex (DEPAKOTE ER) 500 MG 24 hr tablet, Take 4 tablets (2,000 mg total) by mouth at bedtime., Disp: 120 tablet, Rfl: 3 .  HYDROcodone-acetaminophen (NORCO/VICODIN) 5-325 MG tablet, Take 1-2 tablets by mouth every 4 (four) hours as needed for moderate pain., Disp: 30 tablet, Rfl: 0 .  lamoTRIgine (LAMICTAL) 100 MG tablet, Take 3 tablets (300 mg total) by mouth at bedtime., Disp: 90 tablet, Rfl: 3 .  lamoTRIgine (LAMICTAL) 150 MG tablet, Take 2 tablets (300 mg total) by mouth at bedtime., Disp: 60 tablet, Rfl: 3 .  levothyroxine (SYNTHROID, LEVOTHROID) 50 MCG tablet, Take 50 mcg by mouth daily before breakfast., Disp: , Rfl:  .  phentermine 37.5 MG capsule, Take 37.5 mg by mouth every morning., Disp: , Rfl:  .  topiramate (TOPAMAX) 25 MG tablet, Take 25 mg by mouth at bedtime., Disp: , Rfl:    Medication Side Effects: weight gain  Family Medical/ Social History: Changes? Yes with marital separation still in suspension but partially resolved more likely to continue in marriage than to fully divorce.  MENTAL HEALTH EXAM:  There were no vitals taken for this visit.There is no height or weight on file to calculate BMI.as not present  General Appearance: N/A  Eye Contact:  N/A  Speech:  Clear and Coherent, Normal Rate and Talkative  Volume:  Normal  Mood:  Anxious, Dysphoric, Euthymic, Irritable and Worthless  Affect:  Labile, Full Range and Anxious  Thought Process:  Goal Directed, Irrelevant and Linear  Orientation:  Full (Time, Place, and Person)  Thought Content: Illogical, Ilusions, Obsessions, Paranoid Ideation and Rumination   Suicidal Thoughts:  No  Homicidal Thoughts:  No  Memory:  Immediate;   Good Remote;   Fair  Judgement:  Fair  Insight:  Fair  Psychomotor Activity:  Normal, Increased and Mannerisms  Concentration:  Concentration: Fair and Attention  Span: Fair  Recall:  [employer] of Knowledge: Fair  Language: Fair  Assets:  Resilience Social Support Talents/Skills  ADL's:  Intact  Cognition: WNL  Prognosis:  Fair    DIAGNOSES:    ICD-10-CM   1. Chronic post-traumatic stress disorder F43.12 ALPRAZolam (XANAX) 1 MG tablet    lamoTRIgine (LAMICTAL) 150 MG tablet  2. 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 OSA (obstructive sleep apnea) G47.33   4. Attention deficit hyperactivity disorder, combined type F90.2     Receiving Psychotherapy: No Last appointment with Lanetta Inch, [facility] now 9 months ago.   RECOMMENDATIONS: Current adaptation to residence changes and marital disruption notes capacity for balancing tolerance for change and episodic opportunity for improvement.  Psychosupportive therapy integrates past and current therapeutics with matching to symptoms for treatment options and discovery/resolution of episodic problems.  He has no current maladaptive Internet relations or social conflicts.  He is prescribed to continue Xanax 1 mg taking 1/2 tablet total 0.5 mg twice daily as needed for posttraumatic anxiety sent as #30 [facility] [place] [facility] [place] for PTSD and bipolar disorder.  He continues Depakote 500 mg ER taking 4 tablets for total of 2000 mg every bedtime sent as #120 with [facility].  He continues Lamictal 150 mg taking 2 tablets total 300 mg every bedtime sent as #60 [facility] and [facility].  He has no current need for ADHD medication.  He returns in 4 months preferring to contact the office for upcoming appointment as he knows his residence and schedule rather than making the appointment today.  Virtual Visit via Telephone Note  I connected with LIVAN HIRES on 05/06/18 at  2:00 PM EDT by telephone and verified  that I am speaking with the correct person using two identifiers.   I discussed the limitations, risks, security and privacy concerns of performing an evaluation and management service by telephone and the availability of in person appointments. I also discussed with the patient that there may be a patient responsible charge related to this service. The patient expressed understanding and agreed to proceed.   History of Present Illness: Hew declines video telemedicine having military associated PTSD with consent for audio not collateral for psychiatric interview and exam in 37-month evaluation and management of PTSD, bipolar, ADHD, and sleep apnea.    Observations/Objective: Mood:  Anxious, Dysphoric, Euthymic, Irritable and Worthless  Affect:  Labile, Full Range and Anxious  Thought Process:  Goal Directed, Irrelevant and Linear  Orientation:  Full (Time, Place, and Person)  Thought Content: Illogical, Ilusions, Obsessions, Paranoid Ideation and Rumination     Assessment and Plan: Continue Xanax 1 mg taking 1/2 tablet twice daily as needed for posttraumatic anxiety  for PTSD and bipolar disorder.  Continue Depakote 500 mg ER taking 4 tablets for total of 2000 mg every bedtime for bipolar.  Continues Lamictal 150 mg taking 2 tablets total 300 mg every bedtime sent as #60 [facility] and [facility].  Follow Up Instructions: He returns in 4 months preferring to contact the office for upcoming appointment and therapy when willing.   I discussed the assessment and treatment plan with the patient. The patient was provided an opportunity to ask questions and all were answered. The patient agreed with the plan and demonstrated an understanding of the instructions.   The patient was advised to call back or seek an in-person evaluation if the symptoms worsen or if the condition fails to improve as anticipated.  I provided 15 minutes of non-face-to-face time during this  encounter.   Delight Hoh, MD  Delight Hoh, MD

## 2018-05-10 ENCOUNTER — Telehealth: Payer: Self-pay | Admitting: Psychiatry

## 2018-05-10 MED ORDER — DIVALPROEX SODIUM ER 500 MG PO TB24: 2000.0000 mg | ORAL_TABLET | Freq: Every day | ORAL | 3 refills | Status: DC

## 2018-05-10 MED ORDER — ALPRAZOLAM 1 MG PO TABS: 0.5000 mg | ORAL_TABLET | Freq: Two times a day (BID) | ORAL | 3 refills | Status: DC | PRN

## 2018-05-10 MED ORDER — LAMOTRIGINE 150 MG PO TABS: 300.0000 mg | ORAL_TABLET | Freq: Every day | ORAL | 3 refills | Status: DC

## 2018-05-10 NOTE — Telephone Encounter (Signed)
eScription's of 05/06/2018 sent [id number] [place] are canceled there and [facility] [place] as Xanax 1 mg taking 0.5 mg twice daily as needed, Depakote 500 mg ER taking 4 nightly, and Lamictal 150 mg taking 2 nightly each as a 30-day supply and 3 refills [place] necessary with no contraindication now living part-time with mother in [place] and part-time with wife again in [place].

## 2018-05-10 NOTE — Telephone Encounter (Signed)
Paul Simmons called to report that his medications from his visit on 05/06/18 were sent to the wrong pharmacy.  Please send the Xanax and [facility]ote ER [facility].

## 2018-05-23 ENCOUNTER — Emergency Department (HOSPITAL_COMMUNITY)
Admission: EM | Admit: 2018-05-23 | Discharge: 2018-05-23 | Disposition: A | Discharge status: Home / Self Care | Payer: Medicare Other | Attending: Emergency Medicine | Admitting: Emergency Medicine

## 2018-05-23 ENCOUNTER — Emergency Department (HOSPITAL_COMMUNITY): Payer: Medicare Other

## 2018-05-23 ENCOUNTER — Other Ambulatory Visit: Payer: Self-pay

## 2018-05-23 DIAGNOSIS — S8011XA Contusion of right lower leg, initial encounter: Secondary | ICD-10-CM | POA: Insufficient documentation

## 2018-05-23 DIAGNOSIS — Y999 Unspecified external cause status: Secondary | ICD-10-CM | POA: Insufficient documentation

## 2018-05-23 DIAGNOSIS — Y929 Unspecified place or not applicable: Secondary | ICD-10-CM | POA: Insufficient documentation

## 2018-05-23 DIAGNOSIS — S99921A Unspecified injury of right foot, initial encounter: Secondary | ICD-10-CM | POA: Diagnosis not present

## 2018-05-23 DIAGNOSIS — Y9389 Activity, other specified: Secondary | ICD-10-CM | POA: Diagnosis not present

## 2018-05-23 DIAGNOSIS — R109 Unspecified abdominal pain: Secondary | ICD-10-CM | POA: Diagnosis not present

## 2018-05-23 DIAGNOSIS — R0789 Other chest pain: Secondary | ICD-10-CM | POA: Insufficient documentation

## 2018-05-23 DIAGNOSIS — S3991XA Unspecified injury of abdomen, initial encounter: Secondary | ICD-10-CM | POA: Diagnosis not present

## 2018-05-23 DIAGNOSIS — S299XXA Unspecified injury of thorax, initial encounter: Secondary | ICD-10-CM | POA: Diagnosis not present

## 2018-05-23 DIAGNOSIS — S90411A Abrasion, right great toe, initial encounter: Secondary | ICD-10-CM | POA: Insufficient documentation

## 2018-05-23 DIAGNOSIS — S301XXA Contusion of abdominal wall, initial encounter: Secondary | ICD-10-CM

## 2018-05-23 DIAGNOSIS — W1789XA Other fall from one level to another, initial encounter: Secondary | ICD-10-CM | POA: Diagnosis not present

## 2018-05-23 DIAGNOSIS — S8991XA Unspecified injury of right lower leg, initial encounter: Secondary | ICD-10-CM | POA: Diagnosis not present

## 2018-05-23 DIAGNOSIS — E039 Hypothyroidism, unspecified: Secondary | ICD-10-CM | POA: Insufficient documentation

## 2018-05-23 DIAGNOSIS — Z79899 Other long term (current) drug therapy: Secondary | ICD-10-CM | POA: Insufficient documentation

## 2018-05-23 DIAGNOSIS — I10 Essential (primary) hypertension: Secondary | ICD-10-CM | POA: Diagnosis not present

## 2018-05-23 DIAGNOSIS — S80811A Abrasion, right lower leg, initial encounter: Secondary | ICD-10-CM | POA: Diagnosis not present

## 2018-05-23 LAB — CBC WITH DIFFERENTIAL/PLATELET
Abs Immature Granulocytes: 0.07 10*3/uL (ref 0.00–0.07)
Basophils Absolute: 0 10*3/uL (ref 0.0–0.1)
Basophils Relative: 1 %
Eosinophils Absolute: 0.1 10*3/uL (ref 0.0–0.5)
Eosinophils Relative: 2 %
HCT: 45.8 % (ref 39.0–52.0)
Hemoglobin: 15.4 g/dL (ref 13.0–17.0)
Immature Granulocytes: 1 %
Lymphocytes Relative: 32 %
Lymphs Abs: 1.7 10*3/uL (ref 0.7–4.0)
MCH: 29.1 pg (ref 26.0–34.0)
MCHC: 33.6 g/dL (ref 30.0–36.0)
MCV: 86.6 fL (ref 80.0–100.0)
Monocytes Absolute: 0.7 10*3/uL (ref 0.1–1.0)
Monocytes Relative: 12 %
Neutro Abs: 2.7 10*3/uL (ref 1.7–7.7)
Neutrophils Relative %: 52 %
Platelets: 182 10*3/uL (ref 150–400)
RBC: 5.29 MIL/uL (ref 4.22–5.81)
RDW: 13 % (ref 11.5–15.5)
WBC: 5.4 10*3/uL (ref 4.0–10.5)
nRBC: 0 % (ref 0.0–0.2)

## 2018-05-23 LAB — BASIC METABOLIC PANEL
Anion gap: 10 (ref 5–15)
BUN: 8 mg/dL (ref 6–20)
CO2: 25 mmol/L (ref 22–32)
Calcium: 9.2 mg/dL (ref 8.9–10.3)
Chloride: 99 mmol/L (ref 98–111)
Creatinine, Ser: 1.36 mg/dL — ABNORMAL HIGH (ref 0.61–1.24)
GFR calc Af Amer: >60 mL/min (ref >60)
GFR calc non Af Amer: >60 mL/min (ref >60)
Glucose, Bld: 92 mg/dL (ref 70–99)
Potassium: 3.5 mmol/L (ref 3.5–5.1)
Sodium: 134 mmol/L — ABNORMAL LOW (ref 135–145)

## 2018-05-23 MED ORDER — HYDROCODONE-ACETAMINOPHEN 5-325 MG PO TABS: 1.0000 | ORAL_TABLET | Freq: Four times a day (QID) | ORAL | 0 refills | Status: DC | PRN

## 2018-05-23 MED ORDER — IOHEXOL 300 MG/ML  SOLN
100.0000 mL | Freq: Once | INTRAMUSCULAR | Status: AC | PRN
  Administered 2018-05-23: 100 mL via INTRAVENOUS

## 2018-05-23 MED ORDER — ONDANSETRON HCL 4 MG/2ML IJ SOLN
4.0000 mg | Freq: Once | INTRAMUSCULAR | Status: AC
  Administered 2018-05-23: 21:00:00 4 mg via INTRAVENOUS
  Filled 2018-05-23: qty 2

## 2018-05-23 MED ORDER — HYDROMORPHONE HCL 1 MG/ML IJ SOLN
1.0000 mg | Freq: Once | INTRAMUSCULAR | Status: AC
  Administered 2018-05-23: 0.5 mg via INTRAVENOUS
  Filled 2018-05-23: qty 1

## 2018-05-23 MED ORDER — HYDROMORPHONE HCL 1 MG/ML IJ SOLN
1.0000 mg | Freq: Once | INTRAMUSCULAR | Status: AC
  Administered 2018-05-23: 1 mg via INTRAVENOUS
  Filled 2018-05-23: qty 1

## 2018-05-23 NOTE — ED Provider Notes (Signed)
[facility] EMERGENCY DEPARTMENT Provider Note   CSN: [id number] Arrival date & time: 05/23/18  1727    History   Chief Complaint Chief Complaint  Patient presents with  . Leg Injury    HPI Paul DEFINO is a 38 y.o. male.     Patient is a 38 year old male with past medical history of bipolar disorder, PTSD, and previous right leg injury requiring multiple surgical procedures to repair.  This was performed by Dr. Marcelino Scot several years ago.  Today the patient was standing on a porch when he stepped backward off the edge of the porch, struck his leg, then fell to the ground.  He is complaining of severe pain and abrasions to his right leg.  He denies any other injury in the fall.  The history is provided by the patient.    Past Medical History:  Diagnosis Date  . Bipolar 1 disorder ([place])   . Bipolar disorder ([place]) 05/14/2016  . Chronic cholecystitis with calculus 05/14/2016  . Hyperlipidemia   . Hypothyroidism   . OSA (obstructive sleep apnea) 05/14/2016   He has a CPAP machine but does not use it.  Marland Kitchen PTSD (post-traumatic stress disorder) 05/14/2016   Former Social research officer, government stationed in [employer]    Patient Active Problem List   Diagnosis Date Noted  . Chronic post-traumatic stress disorder 05/06/2018  . Attention deficit hyperactivity disorder, combined type 01/22/2018  . S/P laparoscopic cholecystectomy April 2018 05/16/2016  . Chronic cholecystitis with calculus 05/14/2016  . Hypothyroidism 05/14/2016  . OSA (obstructive sleep apnea) 05/14/2016  . Sprain of finger 12/15/2012  . Central slip extensor tendon injury ([place]) 12/15/2012  . Knee pain 01/20/2012  . Leg weakness 01/20/2012    Past Surgical History:  Procedure Laterality Date  . CHOLECYSTECTOMY N/A 05/15/2016   Procedure: LAPAROSCOPIC CHOLECYSTECTOMY WITH INTRAOPERATIVE CHOLANGIOGRAM;  Surgeon: Johnathan Hausen, MD;  Location: [facility];  Service: General;  Laterality: N/A;  . left varicose vein  surgery    . Right Ankle Surgery     x 6 surgeries  . TONSILLECTOMY          Home Medications    Prior to Admission medications   Medication Sig Start Date End Date Taking? Authorizing Provider  ALPRAZolam Duanne Moron) 1 MG tablet Take 0.5 tablets (0.5 mg total) by mouth 2 (two) times daily as needed for anxiety. 05/06/18   Delight Hoh, MD  ALPRAZolam Duanne Moron) 1 MG tablet Take 0.5 tablets (0.5 mg total) by mouth 2 (two) times daily as needed for anxiety. [id number] [place] canceled with that store 05/10/18   Delight Hoh, MD  atorvastatin (LIPITOR) 20 MG tablet Take 20 mg by mouth at bedtime.    [provider]  divalproex (DEPAKOTE ER) 500 MG 24 hr tablet Take 4 tablets (2,000 mg total) by mouth at bedtime. 05/10/18   Delight Hoh, MD  HYDROcodone-acetaminophen (NORCO/VICODIN) 5-325 MG tablet Take 1-2 tablets by mouth every 4 (four) hours as needed for moderate pain. 05/16/16   Johnathan Hausen, MD  lamoTRIgine (LAMICTAL) 100 MG tablet Take 3 tablets (300 mg total) by mouth at bedtime. 02/01/18   Delight Hoh, MD  lamoTRIgine (LAMICTAL) 150 MG tablet Take 2 tablets (300 mg total) by mouth at bedtime. 05/10/18   Delight Hoh, MD  levothyroxine (SYNTHROID, LEVOTHROID) 50 MCG tablet Take 50 mcg by mouth daily before breakfast.    [provider]  phentermine 37.5 MG capsule  Take 37.5 mg by mouth every morning.    [provider]  topiramate (TOPAMAX) 25 MG tablet Take 25 mg by mouth at bedtime.    [provider]    Family History No family history on file.  Social History Social History   Tobacco Use  . Smoking status: Never Smoker  . Smokeless tobacco: Former Systems developer    Types: Chew  Substance Use Topics  . Alcohol use: No    Comment: occassionally  . Drug use: No     Allergies   Patient has no known allergies.   Review of Systems Review of Systems  All other systems reviewed and are  negative.    Physical Exam Updated Vital Signs BP (!) 156/143 (BP Location: Left Arm)   Pulse 87   Temp 97.8 F (36.6 C) (Oral)   Resp 12   Ht 6' (1.829 m)   Wt 122.5 kg   SpO2 95%   BMI 36.62 kg/m   Physical Exam Vitals signs and nursing note reviewed.  Constitutional:      General: He is not in acute distress.    Appearance: He is well-developed. He is not diaphoretic.  HENT:     Head: Normocephalic and atraumatic.  Neck:     Musculoskeletal: Normal range of motion and neck supple.  Cardiovascular:     Rate and Rhythm: Normal rate and regular rhythm.     Heart sounds: No murmur. No friction rub.  Pulmonary:     Effort: Pulmonary effort is normal. No respiratory distress.     Breath sounds: Normal breath sounds. No wheezing or rales.  Abdominal:     General: Bowel sounds are normal. There is no distension.     Palpations: Abdomen is soft.     Tenderness: There is no abdominal tenderness.  Musculoskeletal: Normal range of motion.     Comments: There are abrasions to the anterior aspect of the right lower leg.  There is some swelling and ecchymosis in this area, however no obvious deformity or instability.    There are abrasions to the anterior aspect of the knee and right great toe, however no obvious deformity or significant swelling.  DP pulses are easily palpable and motor and sensation are intact throughout the entire leg.  Range of motion is difficult to assess secondary to level of patient's discomfort.  Skin:    General: Skin is warm and dry.  Neurological:     Mental Status: He is alert and oriented to person, place, and time.     Coordination: Coordination normal.      ED Treatments / Results  Labs (all labs ordered are listed, but only abnormal results are displayed) Labs Reviewed - No data to display  EKG None  Radiology No results found.  Procedures Procedures (including critical care time)  Medications Ordered in ED Medications   HYDROmorphone (DILAUDID) injection 1 mg (has no administration in time range)     Initial Impression / Assessment and Plan / ED Course  I have reviewed the triage vital signs and the nursing notes.  Pertinent labs & imaging results that were available during my care of the patient were reviewed by me and considered in my medical decision making (see chart for details).  Patient presents with complaints of pain in his right leg after falling off of a porch.  He has a history of surgical repair to the same leg.  X-rays show no evidence for new fracture or hardware complication.  At  one point, patient complained of discomfort in his chest and abdomen and was reminded by his wife that he fell and landed on this area.  Chest x-ray and CT scan of the abdomen and pelvis are unremarkable.  Patient appears hemodynamically stable, pain appears well controlled, and I feel as though he is appropriate for discharge.  Patient to use local wound care and is to follow-up as needed.  Final Clinical Impressions(s) / ED Diagnoses   Final diagnoses:  None    ED Discharge Orders    None       Veryl Speak, MD 05/23/18 2049

## 2018-05-23 NOTE — ED Notes (Signed)
Patient verbalizes understanding of discharge instructions. Opportunity for questioning and answers were provided. Armband removed by staff, pt discharged from ED.  

## 2018-05-23 NOTE — ED Notes (Addendum)
Wasted 0.5 g dilauded with Autumn RN

## 2018-05-23 NOTE — ED Triage Notes (Signed)
Pt here for evaluation of leg injury. Sts he "stepped back off the porch and nothing was there." [place] about 6 feet. Pt sts no LOC. Pulses present, no movement or feeling in R foot. Abrasions noted to R leg.

## 2018-05-23 NOTE — ED Notes (Signed)
Patient transported to CT 

## 2018-05-23 NOTE — Discharge Instructions (Addendum)
Hydrocodone as prescribed as needed for pain.  Local wound care with bacitracin and dressing changes twice daily.  Follow-up with primary doctor if symptoms or not improving in the next 3 to 4 days.

## 2018-05-26 DIAGNOSIS — S93401A Sprain of unspecified ligament of right ankle, initial encounter: Secondary | ICD-10-CM | POA: Diagnosis not present

## 2018-05-26 DIAGNOSIS — S9001XA Contusion of right ankle, initial encounter: Secondary | ICD-10-CM | POA: Diagnosis not present

## 2018-06-02 DIAGNOSIS — S9001XD Contusion of right ankle, subsequent encounter: Secondary | ICD-10-CM | POA: Diagnosis not present

## 2018-06-02 DIAGNOSIS — S93401D Sprain of unspecified ligament of right ankle, subsequent encounter: Secondary | ICD-10-CM | POA: Diagnosis not present

## 2018-06-26 ENCOUNTER — Other Ambulatory Visit: Payer: Self-pay | Admitting: Psychiatry

## 2018-06-26 DIAGNOSIS — F3173 Bipolar disorder, in partial remission, most recent episode manic: Secondary | ICD-10-CM

## 2018-06-26 DIAGNOSIS — F4312 Post-traumatic stress disorder, chronic: Secondary | ICD-10-CM

## 2018-08-06 ENCOUNTER — Telehealth: Payer: Self-pay | Admitting: Psychiatry

## 2018-08-06 NOTE — Telephone Encounter (Signed)
Patient would like a letter so he doesn't have to wear a mask due to having panic attacks patient feels boxed in and feels sick when he has to wear a mask.

## 2018-08-06 NOTE — Telephone Encounter (Signed)
Patient calls expecting that the nauseating phobic avoidance he has when wearing his respiratory mask in the grocery as [facility] requires can be negated by a letter from the psychiatrist that he has PTSD and panic not to wear a mask coronavirus pandemic.  Discussed all issues with him and understand that he cannot take his Xanax to go in the grocery and then drive as well as usual to get home since wife is in quarantine from her nursing home job of it exposure and older mother is not available to help.  He is staying at a friend's house who is not there now.  I suggest that he research the medical issues if he is to find some excuse for not having to wear Covid mask but that a psychiatric letter will not do any good.

## 2018-08-09 ENCOUNTER — Other Ambulatory Visit: Payer: Self-pay | Admitting: Psychiatry

## 2018-08-09 DIAGNOSIS — F3173 Bipolar disorder, in partial remission, most recent episode manic: Secondary | ICD-10-CM

## 2018-08-09 DIAGNOSIS — F4312 Post-traumatic stress disorder, chronic: Secondary | ICD-10-CM

## 2018-11-12 DIAGNOSIS — Z03818 Encounter for observation for suspected exposure to other biological agents ruled out: Secondary | ICD-10-CM | POA: Diagnosis not present

## 2018-11-12 DIAGNOSIS — Z20828 Contact with and (suspected) exposure to other viral communicable diseases: Secondary | ICD-10-CM | POA: Diagnosis not present

## 2018-11-12 DIAGNOSIS — Z1159 Encounter for screening for other viral diseases: Secondary | ICD-10-CM | POA: Diagnosis not present

## 2018-11-13 ENCOUNTER — Other Ambulatory Visit: Payer: Self-pay | Admitting: Psychiatry

## 2018-11-13 DIAGNOSIS — F4312 Post-traumatic stress disorder, chronic: Secondary | ICD-10-CM

## 2018-11-13 DIAGNOSIS — F3173 Bipolar disorder, in partial remission, most recent episode manic: Secondary | ICD-10-CM

## 2018-11-15 NOTE — Telephone Encounter (Signed)
2 months overdue for office follow-up patient seeks refill of Lamictal which is appropriate but will not be able to refill Xanax if needed, [place] registry documenting last fill in July as he is nearly beyond the 37-month mandate so reminder given with the Lamictal refill [facility] 150 mg 2 nightly #180 with no refill.

## 2018-11-15 NOTE — Telephone Encounter (Signed)
Last visit 05/06/2018

## 2018-11-17 DIAGNOSIS — U071 COVID-19: Secondary | ICD-10-CM | POA: Diagnosis not present

## 2018-11-17 DIAGNOSIS — E785 Hyperlipidemia, unspecified: Secondary | ICD-10-CM | POA: Diagnosis not present

## 2018-11-17 DIAGNOSIS — E782 Mixed hyperlipidemia: Secondary | ICD-10-CM | POA: Diagnosis not present

## 2018-11-17 DIAGNOSIS — E038 Other specified hypothyroidism: Secondary | ICD-10-CM | POA: Diagnosis not present

## 2018-12-02 DIAGNOSIS — M25572 Pain in left ankle and joints of left foot: Secondary | ICD-10-CM | POA: Diagnosis not present

## 2018-12-02 DIAGNOSIS — M79672 Pain in left foot: Secondary | ICD-10-CM | POA: Diagnosis not present

## 2018-12-20 DIAGNOSIS — E782 Mixed hyperlipidemia: Secondary | ICD-10-CM | POA: Diagnosis not present

## 2018-12-20 DIAGNOSIS — E038 Other specified hypothyroidism: Secondary | ICD-10-CM | POA: Diagnosis not present

## 2018-12-23 DIAGNOSIS — M25572 Pain in left ankle and joints of left foot: Secondary | ICD-10-CM | POA: Diagnosis not present

## 2019-01-03 DIAGNOSIS — M25572 Pain in left ankle and joints of left foot: Secondary | ICD-10-CM | POA: Diagnosis not present

## 2019-01-12 DIAGNOSIS — M25572 Pain in left ankle and joints of left foot: Secondary | ICD-10-CM | POA: Diagnosis not present

## 2019-01-12 DIAGNOSIS — M79672 Pain in left foot: Secondary | ICD-10-CM | POA: Diagnosis not present

## 2019-02-12 ENCOUNTER — Other Ambulatory Visit: Payer: Self-pay | Admitting: Psychiatry

## 2019-02-12 DIAGNOSIS — F3173 Bipolar disorder, in partial remission, most recent episode manic: Secondary | ICD-10-CM

## 2019-02-12 DIAGNOSIS — F4312 Post-traumatic stress disorder, chronic: Secondary | ICD-10-CM

## 2019-02-14 DIAGNOSIS — M25572 Pain in left ankle and joints of left foot: Secondary | ICD-10-CM | POA: Diagnosis not present

## 2019-02-14 DIAGNOSIS — M79672 Pain in left foot: Secondary | ICD-10-CM | POA: Diagnosis not present

## 2019-02-15 NOTE — Telephone Encounter (Signed)
Last visit is March 2020, last refill submitted in October recommended patient needs apt for refills

## 2019-02-15 NOTE — Telephone Encounter (Signed)
Last appt 05/06/2018 with last Xanax dispensing 09/02/2018 starting phentermine in the interim from PCP.  3 months overdue follow up not responding to reiminders. Month supply of Lamictal sent to [facility].

## 2019-02-28 DIAGNOSIS — M7672 Peroneal tendinitis, left leg: Secondary | ICD-10-CM | POA: Diagnosis not present

## 2019-03-04 ENCOUNTER — Other Ambulatory Visit: Payer: Self-pay | Admitting: Psychiatry

## 2019-03-04 DIAGNOSIS — F4312 Post-traumatic stress disorder, chronic: Secondary | ICD-10-CM

## 2019-03-04 DIAGNOSIS — F3173 Bipolar disorder, in partial remission, most recent episode manic: Secondary | ICD-10-CM

## 2019-03-06 NOTE — Telephone Encounter (Signed)
Though provided year supply in mood stabilizer escriptions last appointment and requiring no dispensing of Valium since last July, he did not follow-up at 4 months as expected now 6 months overdue for follow-up.

## 2019-03-06 NOTE — Telephone Encounter (Signed)
Last visit 04/2018

## 2019-03-07 DIAGNOSIS — M7672 Peroneal tendinitis, left leg: Secondary | ICD-10-CM | POA: Diagnosis not present

## 2019-03-07 DIAGNOSIS — M25572 Pain in left ankle and joints of left foot: Secondary | ICD-10-CM | POA: Diagnosis not present

## 2019-03-09 ENCOUNTER — Other Ambulatory Visit: Payer: Self-pay

## 2019-03-09 ENCOUNTER — Encounter: Payer: Self-pay | Admitting: Psychiatry

## 2019-03-09 ENCOUNTER — Ambulatory Visit (INDEPENDENT_AMBULATORY_CARE_PROVIDER_SITE_OTHER): Payer: Medicare Other | Admitting: Psychiatry

## 2019-03-09 VITALS — Ht 72.0 in | Wt 290.0 lb

## 2019-03-09 DIAGNOSIS — F4312 Post-traumatic stress disorder, chronic: Secondary | ICD-10-CM

## 2019-03-09 DIAGNOSIS — G4733 Obstructive sleep apnea (adult) (pediatric): Secondary | ICD-10-CM | POA: Diagnosis not present

## 2019-03-09 DIAGNOSIS — F3110 Bipolar disorder, current episode manic without psychotic features, unspecified: Secondary | ICD-10-CM

## 2019-03-09 DIAGNOSIS — F902 Attention-deficit hyperactivity disorder, combined type: Secondary | ICD-10-CM

## 2019-03-09 DIAGNOSIS — F5081 Binge eating disorder: Secondary | ICD-10-CM | POA: Diagnosis not present

## 2019-03-09 DIAGNOSIS — F50819 Binge eating disorder, unspecified: Secondary | ICD-10-CM

## 2019-03-09 MED ORDER — ALPRAZOLAM 1 MG PO TABS: 0.5000 mg | ORAL_TABLET | Freq: Two times a day (BID) | ORAL | 5 refills | Status: DC | PRN

## 2019-03-09 MED ORDER — DIVALPROEX SODIUM ER 500 MG PO TB24: 2000.0000 mg | ORAL_TABLET | Freq: Every day | ORAL | 5 refills | Status: DC

## 2019-03-09 MED ORDER — LAMOTRIGINE 150 MG PO TABS: 300.0000 mg | ORAL_TABLET | Freq: Every day | ORAL | 5 refills | Status: DC

## 2019-03-09 MED ORDER — LAMOTRIGINE 150 MG PO TABS: 300.0000 mg | ORAL_TABLET | Freq: Every day | ORAL | 1 refills | Status: DC
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
## 2019-03-09 NOTE — Telephone Encounter (Signed)
[facility]al 150 mg 1 daily #90, 1 refill submitted per request. Patient had apt today.

## 2019-03-11 DIAGNOSIS — M25572 Pain in left ankle and joints of left foot: Secondary | ICD-10-CM | POA: Diagnosis not present

## 2019-03-11 DIAGNOSIS — M7672 Peroneal tendinitis, left leg: Secondary | ICD-10-CM | POA: Diagnosis not present

## 2019-03-14 DIAGNOSIS — M25572 Pain in left ankle and joints of left foot: Secondary | ICD-10-CM | POA: Diagnosis not present

## 2019-03-14 DIAGNOSIS — M7672 Peroneal tendinitis, left leg: Secondary | ICD-10-CM | POA: Diagnosis not present

## 2019-03-16 DIAGNOSIS — M7672 Peroneal tendinitis, left leg: Secondary | ICD-10-CM | POA: Diagnosis not present

## 2019-03-16 DIAGNOSIS — M25572 Pain in left ankle and joints of left foot: Secondary | ICD-10-CM | POA: Diagnosis not present

## 2019-03-21 DIAGNOSIS — M7672 Peroneal tendinitis, left leg: Secondary | ICD-10-CM | POA: Diagnosis not present

## 2019-03-21 DIAGNOSIS — M25572 Pain in left ankle and joints of left foot: Secondary | ICD-10-CM | POA: Diagnosis not present

## 2019-03-23 DIAGNOSIS — M7672 Peroneal tendinitis, left leg: Secondary | ICD-10-CM | POA: Diagnosis not present

## 2019-03-23 DIAGNOSIS — M25572 Pain in left ankle and joints of left foot: Secondary | ICD-10-CM | POA: Diagnosis not present

## 2019-03-28 DIAGNOSIS — M25572 Pain in left ankle and joints of left foot: Secondary | ICD-10-CM | POA: Diagnosis not present

## 2019-03-28 DIAGNOSIS — M7672 Peroneal tendinitis, left leg: Secondary | ICD-10-CM | POA: Diagnosis not present

## 2019-03-30 DIAGNOSIS — M7672 Peroneal tendinitis, left leg: Secondary | ICD-10-CM | POA: Diagnosis not present

## 2019-03-30 DIAGNOSIS — M25572 Pain in left ankle and joints of left foot: Secondary | ICD-10-CM | POA: Diagnosis not present

## 2019-04-04 DIAGNOSIS — M25572 Pain in left ankle and joints of left foot: Secondary | ICD-10-CM | POA: Diagnosis not present

## 2019-04-04 DIAGNOSIS — M7672 Peroneal tendinitis, left leg: Secondary | ICD-10-CM | POA: Diagnosis not present

## 2019-04-06 DIAGNOSIS — M7672 Peroneal tendinitis, left leg: Secondary | ICD-10-CM | POA: Diagnosis not present

## 2019-04-06 DIAGNOSIS — M25572 Pain in left ankle and joints of left foot: Secondary | ICD-10-CM | POA: Diagnosis not present

## 2019-04-11 DIAGNOSIS — M545 Low back pain: Secondary | ICD-10-CM | POA: Diagnosis not present

## 2019-04-13 DIAGNOSIS — M7672 Peroneal tendinitis, left leg: Secondary | ICD-10-CM | POA: Diagnosis not present

## 2019-04-13 DIAGNOSIS — M25572 Pain in left ankle and joints of left foot: Secondary | ICD-10-CM | POA: Diagnosis not present

## 2019-04-18 DIAGNOSIS — M545 Low back pain: Secondary | ICD-10-CM | POA: Diagnosis not present

## 2019-04-18 DIAGNOSIS — M7672 Peroneal tendinitis, left leg: Secondary | ICD-10-CM | POA: Diagnosis not present

## 2019-04-22 DIAGNOSIS — M5136 Other intervertebral disc degeneration, lumbar region: Secondary | ICD-10-CM | POA: Diagnosis not present

## 2019-04-22 DIAGNOSIS — M545 Low back pain: Secondary | ICD-10-CM | POA: Diagnosis not present

## 2019-04-25 ENCOUNTER — Other Ambulatory Visit (HOSPITAL_COMMUNITY): Payer: Self-pay | Admitting: Orthopedic Surgery

## 2019-05-04 ENCOUNTER — Encounter (HOSPITAL_BASED_OUTPATIENT_CLINIC_OR_DEPARTMENT_OTHER): Payer: Self-pay | Admitting: Orthopedic Surgery

## 2019-05-04 ENCOUNTER — Other Ambulatory Visit: Payer: Self-pay

## 2019-05-05 ENCOUNTER — Ambulatory Visit: Payer: Medicare Other

## 2019-05-06 ENCOUNTER — Ambulatory Visit: Payer: Medicare Other | Attending: Internal Medicine

## 2019-05-06 DIAGNOSIS — Z23 Encounter for immunization: Secondary | ICD-10-CM

## 2019-05-06 NOTE — Progress Notes (Signed)
   Covid-19 Vaccination Clinic  Name:  VENSON STUMBO    MRN: [id number] DOB: July 25, 1980  05/06/2019  Mr. Vazguez was observed post Covid-19 immunization for 15 minutes without incident. He was provided with Vaccine Information Sheet and instruction to access the V-Safe system.   Mr. Kosier was instructed to call 911 with any severe reactions post vaccine: Marland Kitchen Difficulty breathing  . Swelling of face and throat  . A fast heartbeat  . A bad rash all over body  . Dizziness and weakness   Immunizations Administered    Name Date Dose VIS Date Route   Moderna COVID-19 Vaccine 05/06/2019  8:20 AM 0.5 mL 01/11/2019 Intramuscular   Manufacturer: Moderna   Lot: [id number]   [id number]

## 2019-05-09 ENCOUNTER — Other Ambulatory Visit (HOSPITAL_COMMUNITY)
Admission: RE | Admit: 2019-05-09 | Discharge: 2019-05-09 | Disposition: A | Discharge status: Home / Self Care | Payer: Medicare Other | Source: Ambulatory Visit | Attending: Orthopedic Surgery | Admitting: Orthopedic Surgery

## 2019-05-09 DIAGNOSIS — Z20822 Contact with and (suspected) exposure to covid-19: Secondary | ICD-10-CM | POA: Insufficient documentation

## 2019-05-09 DIAGNOSIS — Z01812 Encounter for preprocedural laboratory examination: Secondary | ICD-10-CM | POA: Insufficient documentation

## 2019-05-09 LAB — SARS CORONAVIRUS 2 (TAT 6-24 HRS): SARS Coronavirus 2: NEGATIVE

## 2019-05-09 NOTE — Progress Notes (Signed)

## 2019-05-12 ENCOUNTER — Other Ambulatory Visit: Payer: Self-pay

## 2019-05-12 ENCOUNTER — Ambulatory Visit (HOSPITAL_BASED_OUTPATIENT_CLINIC_OR_DEPARTMENT_OTHER)
Admission: RE | Admit: 2019-05-12 | Discharge: 2019-05-12 | Disposition: A | Discharge status: Home / Self Care | Payer: Medicare Other | Attending: Orthopedic Surgery | Admitting: Orthopedic Surgery

## 2019-05-12 ENCOUNTER — Encounter (HOSPITAL_BASED_OUTPATIENT_CLINIC_OR_DEPARTMENT_OTHER)
Admission: RE | Disposition: A | Discharge status: Home / Self Care | Payer: Self-pay | Source: Home / Self Care | Attending: Orthopedic Surgery

## 2019-05-12 ENCOUNTER — Encounter (HOSPITAL_BASED_OUTPATIENT_CLINIC_OR_DEPARTMENT_OTHER): Payer: Self-pay | Admitting: Orthopedic Surgery

## 2019-05-12 ENCOUNTER — Ambulatory Visit (HOSPITAL_BASED_OUTPATIENT_CLINIC_OR_DEPARTMENT_OTHER): Payer: Medicare Other | Admitting: Anesthesiology

## 2019-05-12 DIAGNOSIS — X58XXXA Exposure to other specified factors, initial encounter: Secondary | ICD-10-CM | POA: Insufficient documentation

## 2019-05-12 DIAGNOSIS — Z6839 Body mass index (BMI) 39.0-39.9, adult: Secondary | ICD-10-CM | POA: Insufficient documentation

## 2019-05-12 DIAGNOSIS — Z9049 Acquired absence of other specified parts of digestive tract: Secondary | ICD-10-CM | POA: Diagnosis not present

## 2019-05-12 DIAGNOSIS — E039 Hypothyroidism, unspecified: Secondary | ICD-10-CM | POA: Insufficient documentation

## 2019-05-12 DIAGNOSIS — E785 Hyperlipidemia, unspecified: Secondary | ICD-10-CM | POA: Diagnosis not present

## 2019-05-12 DIAGNOSIS — F319 Bipolar disorder, unspecified: Secondary | ICD-10-CM | POA: Insufficient documentation

## 2019-05-12 DIAGNOSIS — G4733 Obstructive sleep apnea (adult) (pediatric): Secondary | ICD-10-CM | POA: Insufficient documentation

## 2019-05-12 DIAGNOSIS — Z79899 Other long term (current) drug therapy: Secondary | ICD-10-CM | POA: Insufficient documentation

## 2019-05-12 DIAGNOSIS — Y939 Activity, unspecified: Secondary | ICD-10-CM | POA: Diagnosis not present

## 2019-05-12 DIAGNOSIS — S86392A Other injury of muscle(s) and tendon(s) of peroneal muscle group at lower leg level, left leg, initial encounter: Secondary | ICD-10-CM | POA: Insufficient documentation

## 2019-05-12 DIAGNOSIS — D1632 Benign neoplasm of short bones of left lower limb: Secondary | ICD-10-CM | POA: Diagnosis not present

## 2019-05-12 DIAGNOSIS — M89372 Hypertrophy of bone, left ankle and foot: Secondary | ICD-10-CM | POA: Diagnosis not present

## 2019-05-12 DIAGNOSIS — G8918 Other acute postprocedural pain: Secondary | ICD-10-CM | POA: Diagnosis not present

## 2019-05-12 DIAGNOSIS — M7672 Peroneal tendinitis, left leg: Secondary | ICD-10-CM | POA: Insufficient documentation

## 2019-05-12 DIAGNOSIS — G4730 Sleep apnea, unspecified: Secondary | ICD-10-CM | POA: Diagnosis not present

## 2019-05-12 DIAGNOSIS — M93272 Osteochondritis dissecans, left ankle and joints of left foot: Secondary | ICD-10-CM | POA: Diagnosis not present

## 2019-05-12 DIAGNOSIS — F419 Anxiety disorder, unspecified: Secondary | ICD-10-CM | POA: Insufficient documentation

## 2019-05-12 DIAGNOSIS — S86122A Laceration of other muscle(s) and tendon(s) of posterior muscle group at lower leg level, left leg, initial encounter: Secondary | ICD-10-CM | POA: Diagnosis not present

## 2019-05-12 DIAGNOSIS — F4310 Post-traumatic stress disorder, unspecified: Secondary | ICD-10-CM | POA: Diagnosis not present

## 2019-05-12 HISTORY — PX: REPAIR OF PERONEUS BREVIS TENDON: SHX6215

## 2019-05-12 SURGERY — REPAIR, TENDON, PERONEUS BREVIS
Anesthesia: General | Site: Leg Lower | Laterality: Left

## 2019-05-12 MED ORDER — FENTANYL CITRATE (PF) 100 MCG/2ML IJ SOLN
INTRAMUSCULAR | Status: AC
  Filled 2019-05-12: qty 2

## 2019-05-12 MED ORDER — MIDAZOLAM HCL 2 MG/2ML IJ SOLN
1.0000 mg | INTRAMUSCULAR | Status: DC | PRN
  Administered 2019-05-12: 2 mg via INTRAVENOUS

## 2019-05-12 MED ORDER — CEFAZOLIN SODIUM-DEXTROSE 2-4 GM/100ML-% IV SOLN
2.0000 g | INTRAVENOUS | Status: AC
  Administered 2019-05-12: 3 g via INTRAVENOUS

## 2019-05-12 MED ORDER — DEXAMETHASONE SODIUM PHOSPHATE 10 MG/ML IJ SOLN
INTRAMUSCULAR | Status: AC
  Filled 2019-05-12: qty 1

## 2019-05-12 MED ORDER — PROPOFOL 500 MG/50ML IV EMUL
INTRAVENOUS | Status: AC
  Filled 2019-05-12: qty 50

## 2019-05-12 MED ORDER — CEFAZOLIN SODIUM 1 G IJ SOLR
INTRAMUSCULAR | Status: AC
  Filled 2019-05-12: qty 10

## 2019-05-12 MED ORDER — DEXMEDETOMIDINE HCL 200 MCG/2ML IV SOLN
INTRAVENOUS | Status: DC | PRN
  Administered 2019-05-12: 16 ug via INTRAVENOUS
  Administered 2019-05-12: 8 ug via INTRAVENOUS

## 2019-05-12 MED ORDER — CEFAZOLIN SODIUM-DEXTROSE 2-4 GM/100ML-% IV SOLN
INTRAVENOUS | Status: AC
  Filled 2019-05-12: qty 100

## 2019-05-12 MED ORDER — VANCOMYCIN HCL 500 MG IV SOLR
INTRAVENOUS | Status: DC | PRN
  Administered 2019-05-12: 500 mg

## 2019-05-12 MED ORDER — 0.9 % SODIUM CHLORIDE (POUR BTL) OPTIME
TOPICAL | Status: DC | PRN
  Administered 2019-05-12: 200 mL

## 2019-05-12 MED ORDER — LIDOCAINE 2% (20 MG/ML) 5 ML SYRINGE
INTRAMUSCULAR | Status: AC
  Filled 2019-05-12: qty 5

## 2019-05-12 MED ORDER — SENNA 8.6 MG PO TABS: 2.0000 | ORAL_TABLET | Freq: Two times a day (BID) | ORAL | 0 refills | Status: DC

## 2019-05-12 MED ORDER — CELECOXIB 200 MG PO CAPS: 200.0000 mg | ORAL_CAPSULE | Freq: Once | ORAL | Status: DC

## 2019-05-12 MED ORDER — ONDANSETRON HCL 4 MG/2ML IJ SOLN
INTRAMUSCULAR | Status: DC | PRN
  Administered 2019-05-12: 4 mg via INTRAVENOUS

## 2019-05-12 MED ORDER — VANCOMYCIN HCL 500 MG IV SOLR
INTRAVENOUS | Status: AC
  Filled 2019-05-12: qty 500

## 2019-05-12 MED ORDER — FENTANYL CITRATE (PF) 100 MCG/2ML IJ SOLN
INTRAMUSCULAR | Status: DC | PRN
  Administered 2019-05-12: 50 ug via INTRAVENOUS

## 2019-05-12 MED ORDER — PROPOFOL 10 MG/ML IV BOLUS
INTRAVENOUS | Status: DC | PRN
  Administered 2019-05-12: 200 mg via INTRAVENOUS

## 2019-05-12 MED ORDER — OXYCODONE HCL 5 MG PO TABS: 5.0000 mg | ORAL_TABLET | ORAL | 0 refills | Status: AC | PRN

## 2019-05-12 MED ORDER — MIDAZOLAM HCL 2 MG/2ML IJ SOLN
INTRAMUSCULAR | Status: AC
  Filled 2019-05-12: qty 2

## 2019-05-12 MED ORDER — DOCUSATE SODIUM 100 MG PO CAPS: 100.0000 mg | ORAL_CAPSULE | Freq: Every day | ORAL | 2 refills | Status: AC | PRN

## 2019-05-12 MED ORDER — ONDANSETRON HCL 4 MG/2ML IJ SOLN
INTRAMUSCULAR | Status: AC
  Filled 2019-05-12: qty 2

## 2019-05-12 MED ORDER — FENTANYL CITRATE (PF) 100 MCG/2ML IJ SOLN
50.0000 ug | INTRAMUSCULAR | Status: DC | PRN
  Administered 2019-05-12: 50 ug via INTRAVENOUS

## 2019-05-12 MED ORDER — HYDROMORPHONE HCL 1 MG/ML IJ SOLN: 0.2500 mg | INTRAMUSCULAR | Status: DC | PRN

## 2019-05-12 MED ORDER — SODIUM CHLORIDE 0.9 % IV SOLN: INTRAVENOUS | Status: DC

## 2019-05-12 MED ORDER — CHLORHEXIDINE GLUCONATE 4 % EX LIQD: 60.0000 mL | Freq: Once | CUTANEOUS | Status: DC

## 2019-05-12 MED ORDER — DEXAMETHASONE SODIUM PHOSPHATE 10 MG/ML IJ SOLN
INTRAMUSCULAR | Status: DC | PRN
  Administered 2019-05-12: 10 mg via INTRAVENOUS

## 2019-05-12 MED ORDER — ASPIRIN EC 81 MG PO TBEC: 81.0000 mg | DELAYED_RELEASE_TABLET | Freq: Two times a day (BID) | ORAL | 0 refills | Status: DC

## 2019-05-12 MED ORDER — ACETAMINOPHEN 500 MG PO TABS: 1000.0000 mg | ORAL_TABLET | Freq: Once | ORAL | Status: DC

## 2019-05-12 MED ORDER — BUPIVACAINE-EPINEPHRINE (PF) 0.5% -1:200000 IJ SOLN
INTRAMUSCULAR | Status: DC | PRN
  Administered 2019-05-12: 30 mL via PERINEURAL
  Administered 2019-05-12: 10 mL via PERINEURAL

## 2019-05-12 MED ORDER — LACTATED RINGERS IV SOLN: INTRAVENOUS | Status: DC

## 2019-05-12 SURGICAL SUPPLY — 74 items
BANDAGE ESMARK 6X9 LF (GAUZE/BANDAGES/DRESSINGS) ×2 IMPLANT
BLADE AVERAGE 25X9 (BLADE) IMPLANT
BLADE SURG 15 STRL LF DISP TIS (BLADE) ×4 IMPLANT
BLADE SURG 15 STRL SS (BLADE) ×6
BNDG COHESIVE 4X5 TAN STRL (GAUZE/BANDAGES/DRESSINGS) ×1 IMPLANT
BNDG COHESIVE 6X5 TAN STRL LF (GAUZE/BANDAGES/DRESSINGS) ×1 IMPLANT
BNDG ESMARK 4X9 LF (GAUZE/BANDAGES/DRESSINGS) IMPLANT
BNDG ESMARK 6X9 LF (GAUZE/BANDAGES/DRESSINGS)
BUR EGG 3PK/BX (BURR) IMPLANT
BUR OVAL CARBIDE 4.0 (BURR) IMPLANT
CHLORAPREP W/TINT 26 (MISCELLANEOUS) ×3 IMPLANT
COVER BACK TABLE 60X90IN (DRAPES) ×3 IMPLANT
CUFF TOURN SGL QUICK 34 (TOURNIQUET CUFF) ×3
CUFF TRNQT CYL 34X4.125X (TOURNIQUET CUFF) ×2 IMPLANT
DECANTER SPIKE VIAL GLASS SM (MISCELLANEOUS) IMPLANT
DRAPE C-ARM 42X72 X-RAY (DRAPES) IMPLANT
DRAPE EXTREMITY T 121X128X90 (DISPOSABLE) ×3 IMPLANT
DRAPE OEC MINIVIEW 54X84 (DRAPES) IMPLANT
DRAPE U-SHAPE 47X51 STRL (DRAPES) ×1 IMPLANT
DRSG MEPITEL 4X7.2 (GAUZE/BANDAGES/DRESSINGS) ×1 IMPLANT
DRSG PAD ABDOMINAL 8X10 ST (GAUZE/BANDAGES/DRESSINGS) ×6 IMPLANT
ELECT REM PT RETURN 9FT ADLT (ELECTROSURGICAL) ×3
ELECTRODE REM PT RTRN 9FT ADLT (ELECTROSURGICAL) ×2 IMPLANT
GAUZE SPONGE 4X4 12PLY STRL (GAUZE/BANDAGES/DRESSINGS) ×3 IMPLANT
GLOVE BIO SURGEON STRL SZ8 (GLOVE) ×3 IMPLANT
GLOVE BIOGEL PI IND STRL 8 (GLOVE) ×4 IMPLANT
GLOVE BIOGEL PI INDICATOR 8 (GLOVE) ×2
GLOVE ECLIPSE 8.0 STRL XLNG CF (GLOVE) ×3 IMPLANT
GOWN STRL REUS W/ TWL LRG LVL3 (GOWN DISPOSABLE) ×2 IMPLANT
GOWN STRL REUS W/ TWL XL LVL3 (GOWN DISPOSABLE) ×4 IMPLANT
GOWN STRL REUS W/TWL LRG LVL3 (GOWN DISPOSABLE) ×3
GOWN STRL REUS W/TWL XL LVL3 (GOWN DISPOSABLE) ×6
K-WIRE .062X4 (WIRE) ×1 IMPLANT
KIT BIO-TENODESIS 3X8 DISP (MISCELLANEOUS)
KIT INSRT BABSR STRL DISP BTN (MISCELLANEOUS) IMPLANT
NDL SUT 6 .5 CRC .975X.05 MAYO (NEEDLE) IMPLANT
NEEDLE HYPO 22GX1.5 SAFETY (NEEDLE) IMPLANT
NEEDLE MAYO TAPER (NEEDLE)
NS IRRIG 1000ML POUR BTL (IV SOLUTION) ×3 IMPLANT
PACK BASIN DAY SURGERY FS (CUSTOM PROCEDURE TRAY) ×3 IMPLANT
PAD CAST 4YDX4 CTTN HI CHSV (CAST SUPPLIES) ×2 IMPLANT
PADDING CAST ABS 4INX4YD NS (CAST SUPPLIES)
PADDING CAST ABS COTTON 4X4 ST (CAST SUPPLIES) IMPLANT
PADDING CAST COTTON 4X4 STRL (CAST SUPPLIES) ×3
PADDING CAST COTTON 6X4 STRL (CAST SUPPLIES) ×3 IMPLANT
PASSER SUT SWANSON 36MM LOOP (INSTRUMENTS) IMPLANT
PENCIL SMOKE EVACUATOR (MISCELLANEOUS) ×3 IMPLANT
RETRIEVER SUT HEWSON (MISCELLANEOUS) IMPLANT
SANITIZER HAND PURELL 535ML FO (MISCELLANEOUS) ×3 IMPLANT
SCOTCHCAST PLUS 4X4 WHITE (CAST SUPPLIES) IMPLANT
SHEET MEDIUM DRAPE 40X70 STRL (DRAPES) ×3 IMPLANT
SLEEVE SCD COMPRESS KNEE MED (MISCELLANEOUS) ×3 IMPLANT
SPLINT FAST PLASTER 5X30 (CAST SUPPLIES) ×20
SPLINT PLASTER CAST FAST 5X30 (CAST SUPPLIES) IMPLANT
SPONGE LAP 18X18 RF (DISPOSABLE) ×3 IMPLANT
STOCKINETTE 6  STRL (DRAPES) ×3
STOCKINETTE 6 STRL (DRAPES) ×2 IMPLANT
SUCTION FRAZIER HANDLE 10FR (MISCELLANEOUS) ×3
SUCTION TUBE FRAZIER 10FR DISP (MISCELLANEOUS) IMPLANT
SUT ETHIBOND 0 MO6 C/R (SUTURE) IMPLANT
SUT ETHIBOND 2 OS 4 DA (SUTURE) IMPLANT
SUT ETHILON 3 0 PS 1 (SUTURE) ×3 IMPLANT
SUT FIBERWIRE 2-0 18 17.9 3/8 (SUTURE)
SUT MERSILENE 2.0 SH NDLE (SUTURE) IMPLANT
SUT MNCRL AB 3-0 PS2 18 (SUTURE) ×3 IMPLANT
SUT VIC AB 0 SH 27 (SUTURE) ×1 IMPLANT
SUT VIC AB 2-0 SH 27 (SUTURE) ×3
SUT VIC AB 2-0 SH 27XBRD (SUTURE) IMPLANT
SUTURE FIBERWR 2-0 18 17.9 3/8 (SUTURE) IMPLANT
SYR BULB 3OZ (MISCELLANEOUS) ×3 IMPLANT
SYR CONTROL 10ML LL (SYRINGE) IMPLANT
TOWEL GREEN STERILE FF (TOWEL DISPOSABLE) ×3 IMPLANT
TUBE CONNECTING 20X1/4 (TUBING) ×1 IMPLANT
UNDERPAD 30X36 HEAVY ABSORB (UNDERPADS AND DIAPERS) ×3 IMPLANT

## 2019-05-12 NOTE — Transfer of Care (Signed)
Immediate Anesthesia Transfer of Care Note  Patient: Paul Simmons  Procedure(s) Performed: Left peroneal tendon debridement and repair (Left Leg Lower)  Patient Location: PACU  Anesthesia Type:General  Level of Consciousness: drowsy  Airway & Oxygen Therapy: Patient Spontanous Breathing and Patient connected to face mask oxygen  Post-op Assessment: Report given to RN and Post -op Vital signs reviewed and stable  Post vital signs: Reviewed and stable  Last Vitals:  Vitals Value Taken Time  BP 88/53 05/12/19 0828  Temp    Pulse 72 05/12/19 0829  Resp 18 05/12/19 0829  SpO2 99 % 05/12/19 0829  Vitals shown include unvalidated device data.  Last Pain:  Vitals:   05/12/19 0720  TempSrc:   PainSc: 0-No pain         Complications: No apparent anesthesia complications

## 2019-05-12 NOTE — Discharge Instructions (Addendum)
Paul Hewitt, MD [facility]  Please read the following information regarding your care after surgery.  Medications  You only need a prescription for the narcotic pain medicine (ex. oxycodone, Percocet, Norco).  All of the other medicines listed below are available over the counter. X Aleve 2 pills twice a day for the first 3 days after surgery. X acetominophen (Tylenol) 650 mg every 4-6 hours as you need for minor to moderate pain X oxycodone as prescribed for severe pain  Narcotic pain medicine (ex. oxycodone, Percocet, Vicodin) will cause constipation.  To prevent this problem, take the following medicines while you are taking any pain medicine. X docusate sodium (Colace) 100 mg twice a day X senna (Senokot) 2 tablets twice a day  X To help prevent blood clots, take a baby aspirin (81 mg) twice a day for two weeks after surgery.  You should also get up every hour while you are awake to move around.    Weight Bearing X Do not bear any weight on the operated leg or foot.  Cast / Splint / Dressing X Keep your splint, cast or dressing clean and dry.  Don't put anything (coat hanger, pencil, etc) down inside of it.  If it gets damp, use a hair dryer on the cool setting to dry it.  If it gets soaked, call the office to schedule an appointment for a cast change.   After your dressing, cast or splint is removed; you may shower, but do not soak or scrub the wound.  Allow the water to run over it, and then gently pat it dry.  Swelling It is normal for you to have swelling where you had surgery.  To reduce swelling and pain, keep your toes above your nose for at least 3 days after surgery.  It may be necessary to keep your foot or leg elevated for several weeks.  If it hurts, it should be elevated.  Follow Up Call my office at [phone] when you are discharged from the hospital or surgery center to schedule an appointment to be seen two weeks after surgery.  Call my office at [phone] if  you develop a fever >101.5 F, nausea, vomiting, bleeding from the surgical site or severe pain.     Post Anesthesia Home Care Instructions  Activity: Get plenty of rest for the remainder of the day. A responsible individual must stay with you for 24 hours following the procedure.  For the next 24 hours, DO NOT: -Drive a car -Operate machinery -Drink alcoholic beverages -Take any medication unless instructed by your physician -Make any legal decisions or sign important papers.  Meals: Start with liquid foods such as gelatin or soup. Progress to regular foods as tolerated. Avoid greasy, spicy, heavy foods. If nausea and/or vomiting occur, drink only clear liquids until the nausea and/or vomiting subsides. Call your physician if vomiting continues.  Special Instructions/Symptoms: Your throat may feel dry or sore from the anesthesia or the breathing tube placed in your throat during surgery. If this causes discomfort, gargle with warm salt water. The discomfort should disappear within 24 hours.  If you had a scopolamine patch placed behind your ear for the management of post- operative nausea and/or vomiting:  1. The medication in the patch is effective for 72 hours, after which it should be removed.  Wrap patch in a tissue and discard in the trash. Wash hands thoroughly with soap and water. 2. You may remove the patch earlier than 72 hours if you experience   unpleasant side effects which may include dry mouth, dizziness or visual disturbances. 3. Avoid touching the patch. Wash your hands with soap and water after contact with the patch.    Regional Anesthesia Blocks  1. Numbness or the inability to move the "blocked" extremity may last from 3-48 hours after placement. The length of time depends on the medication injected and your individual response to the medication. If the numbness is not going away after 48 hours, call your surgeon.  2. The extremity that is blocked will need to be  protected until the numbness is gone and the  Strength has returned. Because you cannot feel it, you will need to take extra care to avoid injury. Because it may be weak, you may have difficulty moving it or using it. You may not know what position it is in without looking at it while the block is in effect.  3. For blocks in the legs and feet, returning to weight bearing and walking needs to be done carefully. You will need to wait until the numbness is entirely gone and the strength has returned. You should be able to move your leg and foot normally before you try and bear weight or walk. You will need someone to be with you when you first try to ensure you do not fall and possibly risk injury.  4. Bruising and tenderness at the needle site are common side effects and will resolve in a few days.  5. Persistent numbness or new problems with movement should be communicated to the surgeon or the [facility] ([phone])/ [facility] ([phone]). 

## 2019-05-12 NOTE — Op Note (Signed)
05/12/2019  8:28 AM  PATIENT:  Paul Simmons  39 y.o. male  PRE-OPERATIVE DIAGNOSIS:  Left leg peroneal tendonitis  POST-OPERATIVE DIAGNOSIS: 1.  Left peroneus longus tendon tear      2.  Left peroneus brevis tendon tear      3.  Hypertrophic peroneal tubercle from the lateral wall of the calcaneus  Procedure(s): 1.  Left peroneus longus tendon repair   2.  Left peroneus brevis tendon repair   3.  Left calcaneus lateral wall exostectomy  SURGEON:  Wylene Simmer, MD  ASSISTANT: Mechele Claude, PA-C  ANESTHESIA:   General, regional  EBL:  minimal   TOURNIQUET:   Total Tourniquet Time Documented: Thigh (Left) - 37 minutes Total: Thigh (Left) - 37 minutes  COMPLICATIONS:  None apparent  DISPOSITION:  Extubated, awake and stable to recovery.  INDICATION FOR PROCEDURE: Patient is a 39 year old male with a long history of left lateral ankle and hindfoot pain.  He has MRI findings of peroneal tendinitis as well as tears of the peroneal tendons.  He has failed nonoperative treatment to date including activity modification, oral anti-inflammatories, physical therapy and bracing.  He presents now for surgical treatment of these painful tendon injuries.  The risks and benefits of the alternative treatment options have been discussed in detail.  The patient wishes to proceed with surgery and specifically understands risks of bleeding, infection, nerve damage, blood clots, need for additional surgery, amputation and death.  PROCEDURE IN DETAIL:  After pre operative consent was obtained, and the correct operative site was identified, the patient was brought to the operating room and placed supine on the OR table.  Anesthesia was administered.  Pre-operative antibiotics were administered.  A surgical timeout was taken.  The left lower extremity was prepped and draped in standard sterile fashion with a tourniquet around the thigh.  The extremity was elevated and the tourniquet was inflated to 250 mmHg.   An incision was then made over the peroneal tendons on the left lateral ankle and hindfoot.  Dissection was carried down through the subcutaneous tissues.  Care was taken to protect branches of the sural nerve.  The tendon sheath was identified.  It was incised distally.  The tendon sheath was released proximally to the level of the superior peroneal retinaculum.  The retinaculum was released from the fibula subperiosteally.  The peroneal tendons were both noted to have significant tenosynovitis.  This was sharply excised with scissors.  There was a low-lying muscle belly noted at the peroneus brevis which was also excised to a level proximal from the retrofibular groove.  The peroneus longus was carefully inspected.  It was noted to have a longitudinal split tear.  This was debrided with scissors of all tenosynovitis to a fresh edge of tendon.  The tendon was then repaired with 2-0 Vicryl running suture.  The peroneus brevis was then carefully inspected.  It was also noted to have a longitudinal split tear.  This again was debrided to the level of healthy tendon and repaired with a running 2-0 Vicryl.  Both tendons were then reduced easily into the retrofibular groove.  There was no tendency for them to displace laterally.  The lateral wall the calcaneus was inspected.  The peroneal tubercle was noted to be hypertrophic and quite prominent.  This was resected with a rondure and smoothed with a rasp.  The wound was then irrigated copiously.  Vancomycin powder was sprinkled in the wound.  The superior peroneal retinaculum was repaired back  to the fibula through drill holes.  The peroneal tendon sheath was repaired with 2-0 Vicryl.  Subcutaneous tissues were approximated with 3-0 Monocryl.  Skin incision was closed with 3-0 nylon.  Sterile dressings were applied followed by a well-padded short leg splint.  The tourniquet was released after application of the dressings.  The patient was awakened from anesthesia and  transported to the recovery room in stable condition.   FOLLOW UP PLAN: Nonweightbearing on the left lower extremity for the next 2 weeks.  Follow-up in the office for suture removal and conversion to a cam boot to initiate weightbearing and active range of motion in plantarflexion and dorsiflexion.  Plan to start physical therapy at 6 weeks postop.  Aspirin for DVT prophylaxis.    Mechele Claude PA-C was present and scrubbed for the duration of the operative case. His assistance was essential in positioning the patient, prepping and draping, gaining and maintaining exposure, performing the operation, closing and dressing the wounds and applying the splint.

## 2019-05-12 NOTE — Anesthesia Procedure Notes (Addendum)
Anesthesia Regional Block: Popliteal block   Pre-Anesthetic Checklist: ,, timeout performed, Correct Patient, Correct Site, Correct Laterality, Correct Procedure, Correct Position, site marked, Risks and benefits discussed, pre-op evaluation,  At surgeon's request and post-op pain management  Laterality: Left  Prep: Maximum Sterile Barrier Precautions used, chloraprep       Needles:  Injection technique: Single-shot  Needle Type: Echogenic Stimulator Needle     Needle Length: 9cm  Needle Gauge: 21     Additional Needles:   Procedures:, nerve stimulator,,, ultrasound used (permanent image in chart),,,,   Nerve Stimulator or Paresthesia:  Response: Peroneal,  Response: Tibial,   Additional Responses:   Narrative:  Start time: 05/12/2019 7:00 AM End time: 05/12/2019 7:15 AM Injection made incrementally with aspirations every 5 mL. Anesthesiologist: Roderic Palau, MD  Additional Notes: 2% Lidocaine skin wheel. Adductor canal block with 10cc of 0.5% Bupivicaine plain w/1:200k epi.

## 2019-05-12 NOTE — H&P (Signed)
Paul Simmons is an 39 y.o. male.   Chief Complaint: Left ankle pain HPI: The patient is a 39 year old male who has a long history of left lateral ankle and hindfoot pain.  He has peroneus brevis and peroneus longus tendon tears.  He has failed nonoperative treatment to date and presents today for surgery.  Past Medical History:  Diagnosis Date  . Bipolar 1 disorder ([place])   . Bipolar disorder ([place]) 05/14/2016  . Chronic cholecystitis with calculus 05/14/2016  . Hyperlipidemia   . Hypothyroidism   . OSA (obstructive sleep apnea) 05/14/2016   He has a CPAP machine but does not use it.  Marland Kitchen PTSD (post-traumatic stress disorder) 05/14/2016   Former Social research officer, government stationed in [employer]    Past Surgical History:  Procedure Laterality Date  . CHOLECYSTECTOMY N/A 05/15/2016   Procedure: LAPAROSCOPIC CHOLECYSTECTOMY WITH INTRAOPERATIVE CHOLANGIOGRAM;  Surgeon: Johnathan Hausen, MD;  Location: [facility];  Service: General;  Laterality: N/A;  . left varicose vein surgery    . Right Ankle Surgery     x 6 surgeries  . TONSILLECTOMY      History reviewed. No pertinent family history. Social History:  reports that he has never smoked. He has quit using smokeless tobacco.  His smokeless tobacco use included chew. He reports current alcohol use. He reports that he does not use drugs.  Allergies: No Known Allergies  Medications Prior to Admission  Medication Sig Dispense Refill  . ALPRAZolam (XANAX) 1 MG tablet Take 0.5 tablets (0.5 mg total) by mouth 2 (two) times daily as needed for anxiety. 30 tablet 5  . atorvastatin (LIPITOR) 20 MG tablet Take 20 mg by mouth at bedtime.    . divalproex (DEPAKOTE ER) 500 MG 24 hr tablet Take 4 tablets (2,000 mg total) by mouth at bedtime. 120 tablet 5  . lamoTRIgine (LAMICTAL) 150 MG tablet Take 2 tablets (300 mg total) by mouth at bedtime. 180 tablet 1  . levothyroxine (SYNTHROID, LEVOTHROID) 50 MCG tablet Take 50 mcg by mouth daily before breakfast.      No results found  for this or any previous visit (from the past 48 hour(s)). No results found.  Review of Systems no recent fever, chills, nausea, vomiting or changes in his appetite  Blood pressure 119/74, pulse 70, temperature (!) 97.5 F (36.4 C), temperature source Tympanic, resp. rate 13, height 6' (1.829 m), weight 133.2 kg, SpO2 99 %. Physical Exam  Well-nourished well-developed man in no apparent distress.  Alert and oriented x4.  Normal mood and affect.  Left lateral hindfoot has healthy skin.  Tender to palpation along the peroneals.  5 out of 5 strength in plantarflexion and eversion.  No lymphadenopathy.  Pulses are palpable in the foot.  Assessment/Plan Left ankle peroneus longus and peroneus brevis tendon tears -to the operating room today for debridement or repair of the 2 injured tendons.  The risks and benefits of the alternative treatment options have been discussed in detail.  The patient wishes to proceed with surgery and specifically understands risks of bleeding, infection, nerve damage, blood clots, need for additional surgery, amputation and death.   Wylene Simmer, MD 06-10-2019, 7:14 AM

## 2019-05-12 NOTE — Anesthesia Procedure Notes (Signed)
Procedure Name: LMA Insertion Date/Time: 05/12/2019 7:32 AM Performed by: Lieutenant Diego, CRNA Pre-anesthesia Checklist: Patient identified, Emergency Drugs available, Suction available and Patient being monitored Patient Re-evaluated:Patient Re-evaluated prior to induction Oxygen Delivery Method: Circle system utilized Preoxygenation: Pre-oxygenation with 100% oxygen Induction Type: IV induction Ventilation: Mask ventilation without difficulty LMA: LMA inserted LMA Size: 5.0 Number of attempts: 1 Airway Equipment and Method: Bite block Placement Confirmation: positive ETCO2 and breath sounds checked- equal and bilateral Tube secured with: Tape Dental Injury: Teeth and Oropharynx as per pre-operative assessment

## 2019-05-12 NOTE — Progress Notes (Signed)
Assisted Dr. Edmond Fitzgerald with left, ultrasound guided, popliteal, adductor canal block. Side rails up, monitors on throughout procedure. See vital signs in flow sheet. Tolerated Procedure well. 

## 2019-05-12 NOTE — Anesthesia Postprocedure Evaluation (Signed)
Anesthesia Post Note  Patient: Paul Simmons  Procedure(s) Performed: Left peroneal tendon debridement and repair (Left Leg Lower)     Patient location during evaluation: PACU Anesthesia Type: General and Regional Level of consciousness: awake and alert Pain management: pain level controlled Vital Signs Assessment: post-procedure vital signs reviewed and stable Respiratory status: spontaneous breathing, nonlabored ventilation and respiratory function stable Cardiovascular status: blood pressure returned to baseline and stable Postop Assessment: no apparent nausea or vomiting Anesthetic complications: no    Last Vitals:  Vitals:   05/12/19 0900 05/12/19 0915  BP: (!) 91/51 (!) 95/45  Pulse: 68 62  Resp: 17 19  Temp:    SpO2: 95% 95%    Last Pain:  Vitals:   05/12/19 0845  TempSrc:   PainSc: 0-No pain                 Yides Saidi,W. EDMOND

## 2019-05-12 NOTE — Anesthesia Preprocedure Evaluation (Addendum)
Anesthesia Evaluation  Patient identified by MRN, date of birth, ID band Patient awake    Reviewed: Allergy & Precautions, H&P , NPO status , Patient's Chart, lab work & pertinent test results  Airway Mallampati: III  TM Distance: >3 FB Neck ROM: Full    Dental no notable dental hx. (+) Teeth Intact, Dental Advisory Given   Pulmonary sleep apnea and Continuous Positive Airway Pressure Ventilation ,    Pulmonary exam normal breath sounds clear to auscultation       Cardiovascular negative cardio ROS   Rhythm:Regular Rate:Normal     Neuro/Psych Anxiety Bipolar Disorder negative neurological ROS     GI/Hepatic negative GI ROS, Neg liver ROS,   Endo/Other  Hypothyroidism Morbid obesity  Renal/GU negative Renal ROS  negative genitourinary   Musculoskeletal   Abdominal   Peds  Hematology negative hematology ROS (+)   Anesthesia Other Findings   Reproductive/Obstetrics negative OB ROS                            Anesthesia Physical Anesthesia Plan  ASA: III  Anesthesia Plan: General   Post-op Pain Management:  Regional for Post-op pain   Induction: Intravenous  PONV Risk Score and Plan: 3 and Ondansetron, Dexamethasone and Midazolam  Airway Management Planned: LMA  Additional Equipment:   Intra-op Plan:   Post-operative Plan: Extubation in OR  Informed Consent: I have reviewed the patients History and Physical, chart, labs and discussed the procedure including the risks, benefits and alternatives for the proposed anesthesia with the patient or authorized representative who has indicated his/her understanding and acceptance.     Dental advisory given  Plan Discussed with: CRNA  Anesthesia Plan Comments:         Anesthesia Quick Evaluation

## 2019-05-14 ENCOUNTER — Encounter: Payer: Self-pay | Admitting: *Deleted

## 2019-05-20 ENCOUNTER — Encounter: Payer: Self-pay | Admitting: *Deleted

## 2019-06-07 ENCOUNTER — Ambulatory Visit: Payer: Medicare Other | Attending: Internal Medicine

## 2019-06-07 DIAGNOSIS — Z23 Encounter for immunization: Secondary | ICD-10-CM

## 2019-06-07 NOTE — Progress Notes (Signed)
   Covid-19 Vaccination Clinic  Name:  Paul Simmons    MRN: [id number] DOB: 1980/07/22  06/07/2019  Mr. Poelman was observed post Covid-19 immunization for 15 minutes without incident. He was provided with Vaccine Information Sheet and instruction to access the V-Safe system.   Mr. Krikorian was instructed to call 911 with any severe reactions post vaccine: Marland Kitchen Difficulty breathing  . Swelling of face and throat  . A fast heartbeat  . A bad rash all over body  . Dizziness and weakness   Immunizations Administered    Name Date Dose VIS Date Route   Moderna COVID-19 Vaccine 06/07/2019  1:28 PM 0.5 mL 01/2019 Intramuscular   Manufacturer: Moderna   Lot: [id number]   [place] [id number]

## 2019-06-30 DIAGNOSIS — M25572 Pain in left ankle and joints of left foot: Secondary | ICD-10-CM | POA: Diagnosis not present

## 2019-06-30 DIAGNOSIS — M7672 Peroneal tendinitis, left leg: Secondary | ICD-10-CM | POA: Diagnosis not present

## 2019-07-04 DIAGNOSIS — M7672 Peroneal tendinitis, left leg: Secondary | ICD-10-CM | POA: Diagnosis not present

## 2019-07-04 DIAGNOSIS — M25572 Pain in left ankle and joints of left foot: Secondary | ICD-10-CM | POA: Diagnosis not present

## 2019-07-06 DIAGNOSIS — M7672 Peroneal tendinitis, left leg: Secondary | ICD-10-CM | POA: Diagnosis not present

## 2019-07-06 DIAGNOSIS — M25572 Pain in left ankle and joints of left foot: Secondary | ICD-10-CM | POA: Diagnosis not present

## 2019-07-13 DIAGNOSIS — M7672 Peroneal tendinitis, left leg: Secondary | ICD-10-CM | POA: Diagnosis not present

## 2019-07-13 DIAGNOSIS — M25572 Pain in left ankle and joints of left foot: Secondary | ICD-10-CM | POA: Diagnosis not present

## 2019-07-15 DIAGNOSIS — M25572 Pain in left ankle and joints of left foot: Secondary | ICD-10-CM | POA: Diagnosis not present

## 2019-07-15 DIAGNOSIS — M7672 Peroneal tendinitis, left leg: Secondary | ICD-10-CM | POA: Diagnosis not present

## 2019-07-18 DIAGNOSIS — M25572 Pain in left ankle and joints of left foot: Secondary | ICD-10-CM | POA: Diagnosis not present

## 2019-07-18 DIAGNOSIS — M7672 Peroneal tendinitis, left leg: Secondary | ICD-10-CM | POA: Diagnosis not present

## 2019-07-21 DIAGNOSIS — M25572 Pain in left ankle and joints of left foot: Secondary | ICD-10-CM | POA: Diagnosis not present

## 2019-07-21 DIAGNOSIS — M7672 Peroneal tendinitis, left leg: Secondary | ICD-10-CM | POA: Diagnosis not present

## 2019-07-25 DIAGNOSIS — M25572 Pain in left ankle and joints of left foot: Secondary | ICD-10-CM | POA: Diagnosis not present

## 2019-07-25 DIAGNOSIS — M7672 Peroneal tendinitis, left leg: Secondary | ICD-10-CM | POA: Diagnosis not present

## 2019-07-27 DIAGNOSIS — M7672 Peroneal tendinitis, left leg: Secondary | ICD-10-CM | POA: Diagnosis not present

## 2019-07-27 DIAGNOSIS — M25572 Pain in left ankle and joints of left foot: Secondary | ICD-10-CM | POA: Diagnosis not present

## 2019-08-08 DIAGNOSIS — M7672 Peroneal tendinitis, left leg: Secondary | ICD-10-CM | POA: Diagnosis not present

## 2019-08-08 DIAGNOSIS — M25572 Pain in left ankle and joints of left foot: Secondary | ICD-10-CM | POA: Diagnosis not present

## 2019-08-10 DIAGNOSIS — J028 Acute pharyngitis due to other specified organisms: Secondary | ICD-10-CM | POA: Diagnosis not present

## 2019-08-10 DIAGNOSIS — Z1331 Encounter for screening for depression: Secondary | ICD-10-CM | POA: Diagnosis not present

## 2019-08-10 DIAGNOSIS — M7672 Peroneal tendinitis, left leg: Secondary | ICD-10-CM | POA: Diagnosis not present

## 2019-08-10 DIAGNOSIS — Z0000 Encounter for general adult medical examination without abnormal findings: Secondary | ICD-10-CM | POA: Diagnosis not present

## 2019-08-10 DIAGNOSIS — U071 COVID-19: Secondary | ICD-10-CM | POA: Diagnosis not present

## 2019-08-10 DIAGNOSIS — E782 Mixed hyperlipidemia: Secondary | ICD-10-CM | POA: Diagnosis not present

## 2019-08-10 DIAGNOSIS — M25572 Pain in left ankle and joints of left foot: Secondary | ICD-10-CM | POA: Diagnosis not present

## 2019-08-10 DIAGNOSIS — E038 Other specified hypothyroidism: Secondary | ICD-10-CM | POA: Diagnosis not present

## 2019-08-17 DIAGNOSIS — M25572 Pain in left ankle and joints of left foot: Secondary | ICD-10-CM | POA: Diagnosis not present

## 2019-08-17 DIAGNOSIS — M7672 Peroneal tendinitis, left leg: Secondary | ICD-10-CM | POA: Diagnosis not present

## 2019-08-23 DIAGNOSIS — M7672 Peroneal tendinitis, left leg: Secondary | ICD-10-CM | POA: Diagnosis not present

## 2019-08-23 DIAGNOSIS — M25572 Pain in left ankle and joints of left foot: Secondary | ICD-10-CM | POA: Diagnosis not present

## 2019-08-26 DIAGNOSIS — M7672 Peroneal tendinitis, left leg: Secondary | ICD-10-CM | POA: Diagnosis not present

## 2019-08-26 DIAGNOSIS — M25572 Pain in left ankle and joints of left foot: Secondary | ICD-10-CM | POA: Diagnosis not present

## 2019-08-29 DIAGNOSIS — M7672 Peroneal tendinitis, left leg: Secondary | ICD-10-CM | POA: Diagnosis not present

## 2019-08-29 DIAGNOSIS — M25572 Pain in left ankle and joints of left foot: Secondary | ICD-10-CM | POA: Diagnosis not present

## 2019-09-01 DIAGNOSIS — M7672 Peroneal tendinitis, left leg: Secondary | ICD-10-CM | POA: Diagnosis not present

## 2019-09-01 DIAGNOSIS — M25572 Pain in left ankle and joints of left foot: Secondary | ICD-10-CM | POA: Diagnosis not present

## 2019-09-05 ENCOUNTER — Ambulatory Visit: Payer: Medicare Other | Admitting: Psychiatry

## 2019-09-05 ENCOUNTER — Ambulatory Visit (INDEPENDENT_AMBULATORY_CARE_PROVIDER_SITE_OTHER): Payer: Medicare Other | Admitting: Psychiatry

## 2019-09-05 ENCOUNTER — Other Ambulatory Visit: Payer: Self-pay

## 2019-09-05 ENCOUNTER — Encounter: Payer: Self-pay | Admitting: Psychiatry

## 2019-09-05 VITALS — Ht 72.0 in | Wt 293.0 lb

## 2019-09-05 DIAGNOSIS — F4312 Post-traumatic stress disorder, chronic: Secondary | ICD-10-CM

## 2019-09-05 DIAGNOSIS — F902 Attention-deficit hyperactivity disorder, combined type: Secondary | ICD-10-CM | POA: Diagnosis not present

## 2019-09-05 DIAGNOSIS — G4733 Obstructive sleep apnea (adult) (pediatric): Secondary | ICD-10-CM | POA: Diagnosis not present

## 2019-09-05 DIAGNOSIS — F3110 Bipolar disorder, current episode manic without psychotic features, unspecified: Secondary | ICD-10-CM

## 2019-09-05 DIAGNOSIS — F50819 Binge eating disorder, unspecified: Secondary | ICD-10-CM

## 2019-09-05 DIAGNOSIS — F5081 Binge eating disorder: Secondary | ICD-10-CM | POA: Diagnosis not present

## 2019-09-05 DIAGNOSIS — M25572 Pain in left ankle and joints of left foot: Secondary | ICD-10-CM | POA: Diagnosis not present

## 2019-09-05 MED ORDER — LAMOTRIGINE 150 MG PO TABS: 300.0000 mg | ORAL_TABLET | Freq: Every day | ORAL | 1 refills | Status: DC

## 2019-09-05 MED ORDER — DIVALPROEX SODIUM ER 500 MG PO TB24: 2000.0000 mg | ORAL_TABLET | Freq: Every day | ORAL | 5 refills | Status: DC

## 2019-09-05 MED ORDER — ALPRAZOLAM 1 MG PO TABS: 1.0000 mg | ORAL_TABLET | Freq: Two times a day (BID) | ORAL | 5 refills | Status: DC | PRN

## 2019-09-05 NOTE — Progress Notes (Signed)
[facility]  Patient ID: Paul Simmons,  MRN: [id number]  PCP: Neale Burly, MD  Date of Evaluation: 09/05/2019 Time spent:25 minutes from 1120 to 51  Chief Complaint:  Chief Complaint    Anxiety; Trauma; Depression; Manic Behavior; Agitation; ADHD; Eating Disorder      HISTORY/CURRENT STATUS: Paul Simmons is seen onsite in office 25 minutes face-to-face individually with consent with epic collateral for psychiatric interview and exam in 31-month evaluation and management of PTSD, bipolar currently more depressed, ADHD, binge eating disorder, and sleep apnea.  Patient arrives 45 minutes late and was rescheduled to just before the noon hour.  He reports that his best friend died from pancreatitis yesterday both being 39 years of age to celebrate their birthdays together at the end of August as they have since 46 years of age.  Mother's birthday was yesterday patient respectful but staying apart as he was angry over the death of his close friend who declined care by.  Doctor until decompensated to the point that [facility] had  to send him by helicopter to [place]. Merrel expects weeks of decompensation.  He reports a goal weight of 180 pounds set by his internist after previous labs possiby to be repeated as chiropractor provides nutrition and internist medication.  [name] is much closer with his wife as they mutually accept her busy schedule during COVID and [facility]'s self-help as wife and mother oversee his Xanax use.  [place] registry documents last Xanax dispensing 07/20/2019 as the third fill of 03/09/2019 E scription filled that today and March 2 previously.  He will need more Xanax available during this time of grief, though he uses less than the prescribed amount over his 21-month intervals between appointments.  He has no mania, suicidality, psychosis or delirium today.  Depression             The patient presents withdepression as a recurrentproblem usually less frequent than manic  symptoms starting in 1988 though currently grieving the death of her friend.The onset quality was sudden. The problem occurs intermittently.The problem has been waxing and waning since onset.Associated symptoms include decreased concentration,insomnia,irritability,decreased interest,appetite change, mood swings, and episodic sadness. Associated symptoms include no fatigue,no helplessness,no hopelessness,no restlessness,no body aches,no headaches,no indigestion, hypersexuality, no expansive mood swings, and no suicidal ideas.The symptoms are aggravated by social issues and family issues.Past treatments include other medications, psychotherapy and SNRIs - Serotonin and norepinephrine reuptake inhibitors.Compliance with treatment is good.Past compliance problems include difficulty with treatment plan and medication issues.Previous treatment provided moderaterelief.Risk factors include prior traumatic experience, stress, major life event, history of self-injury, family history of mental illness, history of mental illness, family history and emotional abuse. Past medical history includes anxiety,bipolar disorder,depression,mental health disorderand post-traumatic stress disorder. Pertinent negatives include no recent illness,no life-threatening condition,no physical disability,no recent psychiatric admission,no eating disorder,no obsessive-compulsive disorder,no schizophrenia,no suicide attemptsand no head trauma.  Individual Medical History/ Review of Systems: Changes? :Yes Patient reviewing plans for nutrition interventions by his chiropractor who helped him lose 100 pounds in the past.  He remains on phentermine with plan to increase and also on Topamax from his internist.  He has weight gain of 3 pounds in 6 months.  Allergies: Patient has no known allergies.  Current Medications:  Current Outpatient Medications:  .  ALPRAZolam (XANAX) 1 MG tablet, Take 1  tablet (1 mg total) by mouth 2 (two) times daily as needed for anxiety or sleep., Disp: 60 tablet, Rfl: 5 .  aspirin EC 81 MG tablet,  Take 1 tablet (81 mg total) by mouth 2 (two) times daily., Disp: 28 tablet, Rfl: 0 .  atorvastatin (LIPITOR) 20 MG tablet, Take 20 mg by mouth at bedtime., Disp: , Rfl:  .  divalproex (DEPAKOTE ER) 500 MG 24 hr tablet, Take 4 tablets (2,000 mg total) by mouth at bedtime., Disp: 120 tablet, Rfl: 5 .  docusate sodium (COLACE) 100 MG capsule, Take 1 capsule (100 mg total) by mouth daily as needed., Disp: 30 capsule, Rfl: 2 .  lamoTRIgine (LAMICTAL) 150 MG tablet, Take 2 tablets (300 mg total) by mouth at bedtime., Disp: 180 tablet, Rfl: 1 .  levothyroxine (SYNTHROID, LEVOTHROID) 50 MCG tablet, Take 50 mcg by mouth daily before breakfast., Disp: , Rfl:  .  senna (SENOKOT) 8.6 MG TABS tablet, Take 2 tablets (17.2 mg total) by mouth 2 (two) times daily., Disp: 30 tablet, Rfl: 0  Medication Side Effects: none   Family Medical/ Social History: Changes? No  MENTAL HEALTH EXAM:  Height 6' (1.829 m), weight (!) 293 lb (132.9 kg).Body mass index is 39.74 kg/m. Muscle strengths and tone 5/5, postural reflexes and gait 0/0, and AIMS = 0.  General Appearance: Casual, Fairly Groomed, Meticulous and Obese  Eye Contact:  Fair  Speech:  Clear and Coherent, Normal Rate and Talkative  Volume:  Normal  Mood:  Anxious, Depressed, Dysphoric and Irritable  Affect:  Congruent, Depressed, Restricted and Anxious  Thought Process:  Coherent, Goal Directed, Irrelevant, Linear and Descriptions of Associations: Tangential  Orientation:  Full (Time, Place, and Person)  Thought Content: Ilusions, Rumination and Tangential   Suicidal Thoughts:  No  Homicidal Thoughts:  No  Memory:  Immediate;   Good and Fair Remote;   Good and Fair  Judgement:  Fair  Insight:  Fair  Psychomotor Activity:  Decreased and Mannerisms  Concentration:  Concentration: Fair and Attention Span: Fair  Recall:   [employer] of Knowledge: Good  Language: Good  Assets:  Desire for Improvement Intimacy Resilience Talents/Skills  ADL's:  Intact  Cognition: WNL  Prognosis:  Fair    DIAGNOSES:    ICD-10-CM   1. Chronic post-traumatic stress disorder  F43.12 lamoTRIgine (LAMICTAL) 150 MG tablet    divalproex (DEPAKOTE ER) 500 MG 24 hr tablet    ALPRAZolam (XANAX) 1 MG tablet  2. Bipolar I disorder, most recent episode (or current) manic (HCC)  F31.10 lamoTRIgine (LAMICTAL) 150 MG tablet    divalproex (DEPAKOTE ER) 500 MG 24 hr tablet  3. Attention deficit hyperactivity disorder, combined type  F90.2   4. Binge eating disorder  F50.81   5. OSA (obstructive sleep apnea)  G47.33     Receiving Psychotherapy: No    RECOMMENDATIONS: Lamictal has been a 90-day supply well Depakote ER a 30-day supply due to factors.  Psychosupportive psychoeducation addresses grief and loss, reexperiencing themes, and mood stabilization efforts in time.  Xanax is increased to 1 mg twice daily sent as #60 with 5 refills to [facility] in [place] for PTSD and sleep apnea.  Is E scribed Lamictal 150 mg tablet taking 2 tablets total 300 mg every bedtime sent as #180 with 1 refill to [facility] in [place] for Bipolar and PTSD.  He is E scribed Depakote 500 mg ER taking 4 tablets total 2000 mg daily at bedtime sent as #120 with  5 refills  to [facility] [place] for bipolar and PTSD.  He returns for follow-up in 6 months or sooner if needed updated on prevention and monitoring safety hygiene  and crisis plans if needed.   Delight Hoh, MD

## 2019-09-08 DIAGNOSIS — M79675 Pain in left toe(s): Secondary | ICD-10-CM | POA: Diagnosis not present

## 2019-09-08 DIAGNOSIS — L03032 Cellulitis of left toe: Secondary | ICD-10-CM | POA: Diagnosis not present

## 2019-09-14 DIAGNOSIS — M7672 Peroneal tendinitis, left leg: Secondary | ICD-10-CM | POA: Diagnosis not present

## 2019-09-14 DIAGNOSIS — M25572 Pain in left ankle and joints of left foot: Secondary | ICD-10-CM | POA: Diagnosis not present

## 2019-09-16 DIAGNOSIS — M7672 Peroneal tendinitis, left leg: Secondary | ICD-10-CM | POA: Diagnosis not present

## 2019-09-16 DIAGNOSIS — M25572 Pain in left ankle and joints of left foot: Secondary | ICD-10-CM | POA: Diagnosis not present

## 2019-09-27 DIAGNOSIS — L03032 Cellulitis of left toe: Secondary | ICD-10-CM | POA: Diagnosis not present

## 2019-12-01 ENCOUNTER — Encounter: Payer: Self-pay | Admitting: Psychiatry

## 2020-02-07 ENCOUNTER — Ambulatory Visit: Payer: Medicare Other | Admitting: Psychiatry

## 2020-02-09 DIAGNOSIS — E038 Other specified hypothyroidism: Secondary | ICD-10-CM | POA: Diagnosis not present

## 2020-02-09 DIAGNOSIS — E782 Mixed hyperlipidemia: Secondary | ICD-10-CM | POA: Diagnosis not present

## 2020-02-09 DIAGNOSIS — Z0000 Encounter for general adult medical examination without abnormal findings: Secondary | ICD-10-CM | POA: Diagnosis not present

## 2020-03-05 ENCOUNTER — Ambulatory Visit: Payer: Medicare Other | Admitting: Psychiatry

## 2020-03-08 ENCOUNTER — Other Ambulatory Visit: Payer: Self-pay

## 2020-03-08 ENCOUNTER — Ambulatory Visit (INDEPENDENT_AMBULATORY_CARE_PROVIDER_SITE_OTHER): Payer: Medicare Other | Admitting: Adult Health

## 2020-03-08 ENCOUNTER — Encounter: Payer: Self-pay | Admitting: Adult Health

## 2020-03-08 DIAGNOSIS — F5081 Binge eating disorder: Secondary | ICD-10-CM

## 2020-03-08 DIAGNOSIS — F902 Attention-deficit hyperactivity disorder, combined type: Secondary | ICD-10-CM | POA: Diagnosis not present

## 2020-03-08 DIAGNOSIS — Z79899 Other long term (current) drug therapy: Secondary | ICD-10-CM | POA: Diagnosis not present

## 2020-03-08 DIAGNOSIS — F4312 Post-traumatic stress disorder, chronic: Secondary | ICD-10-CM

## 2020-03-08 DIAGNOSIS — F3110 Bipolar disorder, current episode manic without psychotic features, unspecified: Secondary | ICD-10-CM | POA: Diagnosis not present

## 2020-03-08 MED ORDER — LAMOTRIGINE 150 MG PO TABS: 300.0000 mg | ORAL_TABLET | Freq: Every day | ORAL | 1 refills | Status: DC

## 2020-03-08 MED ORDER — ALPRAZOLAM 1 MG PO TABS: 1.0000 mg | ORAL_TABLET | Freq: Two times a day (BID) | ORAL | 2 refills | Status: DC | PRN

## 2020-03-08 MED ORDER — DIVALPROEX SODIUM ER 500 MG PO TB24: 2000.0000 mg | ORAL_TABLET | Freq: Every day | ORAL | 5 refills | Status: DC

## 2020-03-08 NOTE — Progress Notes (Signed)
Paul Simmons [id number] 1980-06-15 40 y.o.  Subjective:   Patient ID:  Paul Simmons is a 40 y.o. (DOB December 31, 1980) male.  Chief Complaint: No chief complaint on file.   HPI Paul Simmons presents to the office today for follow-up of ADHD, Binge eating, BPD-1, PTSD, and high risk medication use.  Describes mood today as "ok". Pleasant. Mood symptoms - reports depression, anxiety, and irritability at times. Stating "I'm doing alright". Feels like medications are working well for him. Recent surgery - deviated septum - recovering. Stable interest and motivation. Taking medications as prescribed.  Energy levels stable. Active, does not have a regular exercise routine. Walking some days. Enjoys some usual interests and activities. Married. Lives with wife of 6 years and 1 dog. Family in area. Spending time with family. Appetite adequate. Weight - 284 pounds. Sleeps better some nights than others. Averages 7 hours. Focus and concentration stable - "depends on what I'm doing". Completing tasks. Managing aspects of household. Disabled since 2008. Denies SI or HI.  Denies AH or VH.  Previous medication trials: Unknown  Review of Systems:  Review of Systems  Musculoskeletal: Negative for gait problem.  Neurological: Negative for tremors.  Psychiatric/Behavioral:       Please refer to HPI    Medications: I have reviewed the patient's current medications.  Current Outpatient Medications  Medication Sig Dispense Refill  . ALPRAZolam (XANAX) 1 MG tablet Take 1 tablet (1 mg total) by mouth 2 (two) times daily as needed for anxiety or sleep. 60 tablet 2  . aspirin EC 81 MG tablet Take 1 tablet (81 mg total) by mouth 2 (two) times daily. 28 tablet 0  . divalproex (DEPAKOTE ER) 500 MG 24 hr tablet Take 4 tablets (2,000 mg total) by mouth at bedtime. 120 tablet 5  . docusate sodium (COLACE) 100 MG capsule Take 1 capsule (100 mg total) by mouth daily as needed. 30 capsule 2  . lamoTRIgine  (LAMICTAL) 150 MG tablet Take 2 tablets (300 mg total) by mouth at bedtime. 180 tablet 1  . levothyroxine (SYNTHROID, LEVOTHROID) 50 MCG tablet Take 50 mcg by mouth daily before breakfast.    . senna (SENOKOT) 8.6 MG TABS tablet Take 2 tablets (17.2 mg total) by mouth 2 (two) times daily. 30 tablet 0   No current facility-administered medications for this visit.    Medication Side Effects: None  Allergies: No Known Allergies  Past Medical History:  Diagnosis Date  . Bipolar 1 disorder ([place])   . Bipolar disorder ([place]) 05/14/2016  . Bipolar I disorder, most recent episode (or current) manic ([place]) 01/22/2018  . Chronic cholecystitis with calculus 05/14/2016  . Hyperlipidemia   . Hypothyroidism   . OSA (obstructive sleep apnea) 05/14/2016   He has a CPAP machine but does not use it.  Marland Kitchen PTSD (post-traumatic stress disorder) 05/14/2016   Former Social research officer, government stationed in [place]    No family history on file.  Social History   Socioeconomic History  . Marital status: Married    Spouse name: Not on file  . Number of children: Not on file  . Years of education: Not on file  . Highest education level: Not on file  Occupational History  . Not on file  Tobacco Use  . Smoking status: Never Smoker  . Smokeless tobacco: Former Systems developer    Types: Secondary school teacher  . Vaping Use: Never used  Substance and Sexual Activity  . Alcohol use: Yes    Comment:  occassionally  . Drug use: No  . Sexual activity: Yes  Other Topics Concern  . Not on file  Social History Narrative  . Not on file   Social Determinants of Health   Financial Resource Strain: Not on file  Food Insecurity: Not on file  Transportation Needs: Not on file  Physical Activity: Not on file  Stress: Not on file  Social Connections: Not on file  Intimate Partner Violence: Not on file    Past Medical History, Surgical history, Social history, and Family history were reviewed and updated as appropriate.   Please see review of  systems for further details on the patient's review from today.   Objective:   Physical Exam:  There were no vitals taken for this visit.  Physical Exam Constitutional:      General: He is not in acute distress. Musculoskeletal:        General: No deformity.  Neurological:     Mental Status: He is alert and oriented to person, place, and time.     Coordination: Coordination normal.  Psychiatric:        Attention and Perception: Attention and perception normal. He does not perceive auditory or visual hallucinations.        Mood and Affect: Mood normal. Mood is not anxious or depressed. Affect is not labile, blunt, angry or inappropriate.        Speech: Speech normal.        Behavior: Behavior normal.        Thought Content: Thought content normal. Thought content is not paranoid or delusional. Thought content does not include homicidal or suicidal ideation. Thought content does not include homicidal or suicidal plan.        Cognition and Memory: Cognition and memory normal.        Judgment: Judgment normal.     Comments: Insight intact     Lab Review:     Component Value Date/Time   NA 134 (L) 05/23/2018 1842   K 3.5 05/23/2018 1842   CL 99 05/23/2018 1842   CO2 25 05/23/2018 1842   GLUCOSE 92 05/23/2018 1842   BUN 8 05/23/2018 1842   CREATININE 1.36 (H) 05/23/2018 1842   CALCIUM 9.2 05/23/2018 1842   PROT 6.6 05/16/2016 0511   ALBUMIN 3.8 05/16/2016 0511   AST 39 05/16/2016 0511   ALT 55 05/16/2016 0511   ALKPHOS 52 05/16/2016 0511   BILITOT 0.6 05/16/2016 0511   GFRNONAA >60 05/23/2018 1842   GFRAA >60 05/23/2018 1842       Component Value Date/Time   WBC 5.4 05/23/2018 1842   RBC 5.29 05/23/2018 1842   HGB 15.4 05/23/2018 1842   HCT 45.8 05/23/2018 1842   PLT 182 05/23/2018 1842   MCV 86.6 05/23/2018 1842   MCH 29.1 05/23/2018 1842   MCHC 33.6 05/23/2018 1842   RDW 13.0 05/23/2018 1842   LYMPHSABS 1.7 05/23/2018 1842   MONOABS 0.7 05/23/2018 1842    EOSABS 0.1 05/23/2018 1842   BASOSABS 0.0 05/23/2018 1842    No results found for: POCLITH, LITHIUM   Lab Results  Component Value Date   VALPROATE 57.9 12/29/2007     .res Assessment: Plan:    Plan:  PDMP reviewed  1. Lamictal 150mg  - 2 at bedtime. 2. Depakote ER 500mg  - 4 at bedtime 3. Xanax 1mg  BID  Labs: Depakote level, LFT, Platelet  Read and reviewed note with patient for accuracy.   RTC 4 weeks  Patient advised to  contact office with any questions, adverse effects, or acute worsening in signs and symptoms.  Counseled patient regarding potential benefits, risks, and side effects of Lamictal to include potential risk of Stevens-Johnson syndrome. Advised patient to stop taking Lamictal and contact office immediately if rash develops and to seek urgent medical attention if rash is severe and/or spreading quickly. Will start Lamictal 25 mg daily for 2 weeks, then increase to 50 mg daily for 2 weeks, then 100 mg daily for 2 weeks, then 150 mg daily for mood symptoms.    Diagnoses and all orders for this visit:  Attention deficit hyperactivity disorder, combined type  Chronic post-traumatic stress disorder -     lamoTRIgine (LAMICTAL) 150 MG tablet; Take 2 tablets (300 mg total) by mouth at bedtime. -     divalproex (DEPAKOTE ER) 500 MG 24 hr tablet; Take 4 tablets (2,000 mg total) by mouth at bedtime. -     ALPRAZolam (XANAX) 1 MG tablet; Take 1 tablet (1 mg total) by mouth 2 (two) times daily as needed for anxiety or sleep.  Bipolar I disorder, most recent episode (or current) manic (HCC) -     lamoTRIgine (LAMICTAL) 150 MG tablet; Take 2 tablets (300 mg total) by mouth at bedtime. -     divalproex (DEPAKOTE ER) 500 MG 24 hr tablet; Take 4 tablets (2,000 mg total) by mouth at bedtime.  Binge eating disorder  High risk medication use -     Valproic acid level -     Hepatic function panel -     CBC     Please see After Visit Summary for patient specific  instructions.  No future appointments.  Orders Placed This Encounter  Procedures  . Valproic acid level  . Hepatic function panel  . CBC    -------------------------------

## 2020-05-07 DIAGNOSIS — Z79899 Other long term (current) drug therapy: Secondary | ICD-10-CM | POA: Diagnosis not present

## 2020-05-28 DIAGNOSIS — A938 Other specified arthropod-borne viral fevers: Secondary | ICD-10-CM | POA: Diagnosis not present

## 2020-08-09 DIAGNOSIS — E039 Hypothyroidism, unspecified: Secondary | ICD-10-CM | POA: Diagnosis not present

## 2020-08-09 DIAGNOSIS — E782 Mixed hyperlipidemia: Secondary | ICD-10-CM | POA: Diagnosis not present

## 2020-08-09 DIAGNOSIS — F3131 Bipolar disorder, current episode depressed, mild: Secondary | ICD-10-CM | POA: Diagnosis not present

## 2020-08-09 DIAGNOSIS — I10 Essential (primary) hypertension: Secondary | ICD-10-CM | POA: Diagnosis not present

## 2020-08-27 IMAGING — CR RIGHT KNEE - COMPLETE 4+ VIEW
4 series · 4 of 4 positions shown · non-contrast
Comparison: None.

CLINICAL DATA: Fall, leg injury.

EXAM:
RIGHT KNEE - COMPLETE 4+ VIEW

[knee ap]
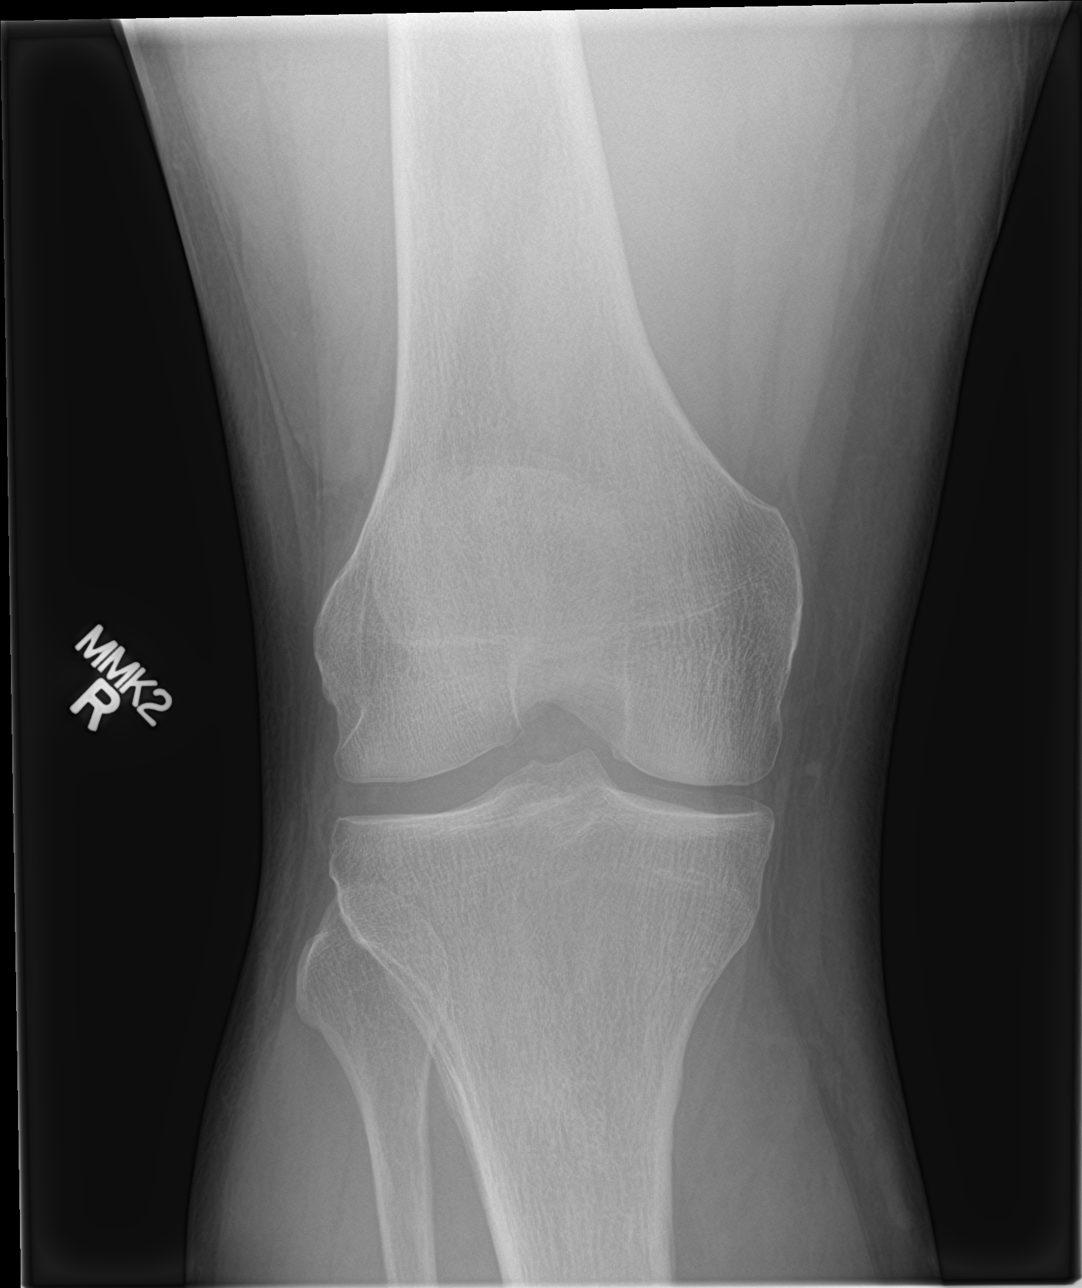

[knee lat]
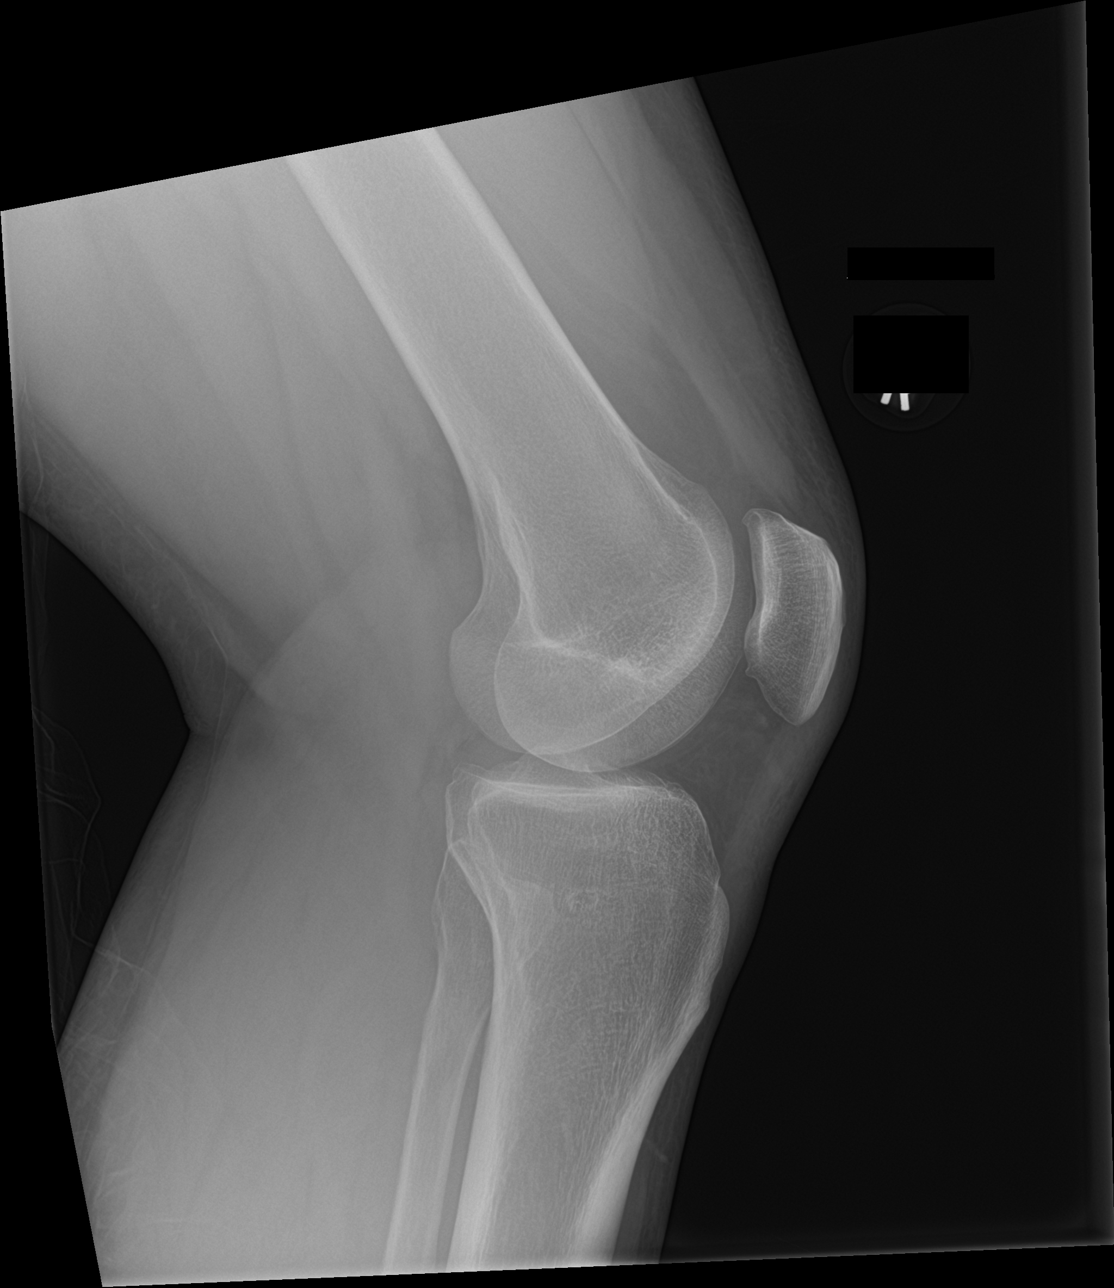

[knee obl (1 of 2)]
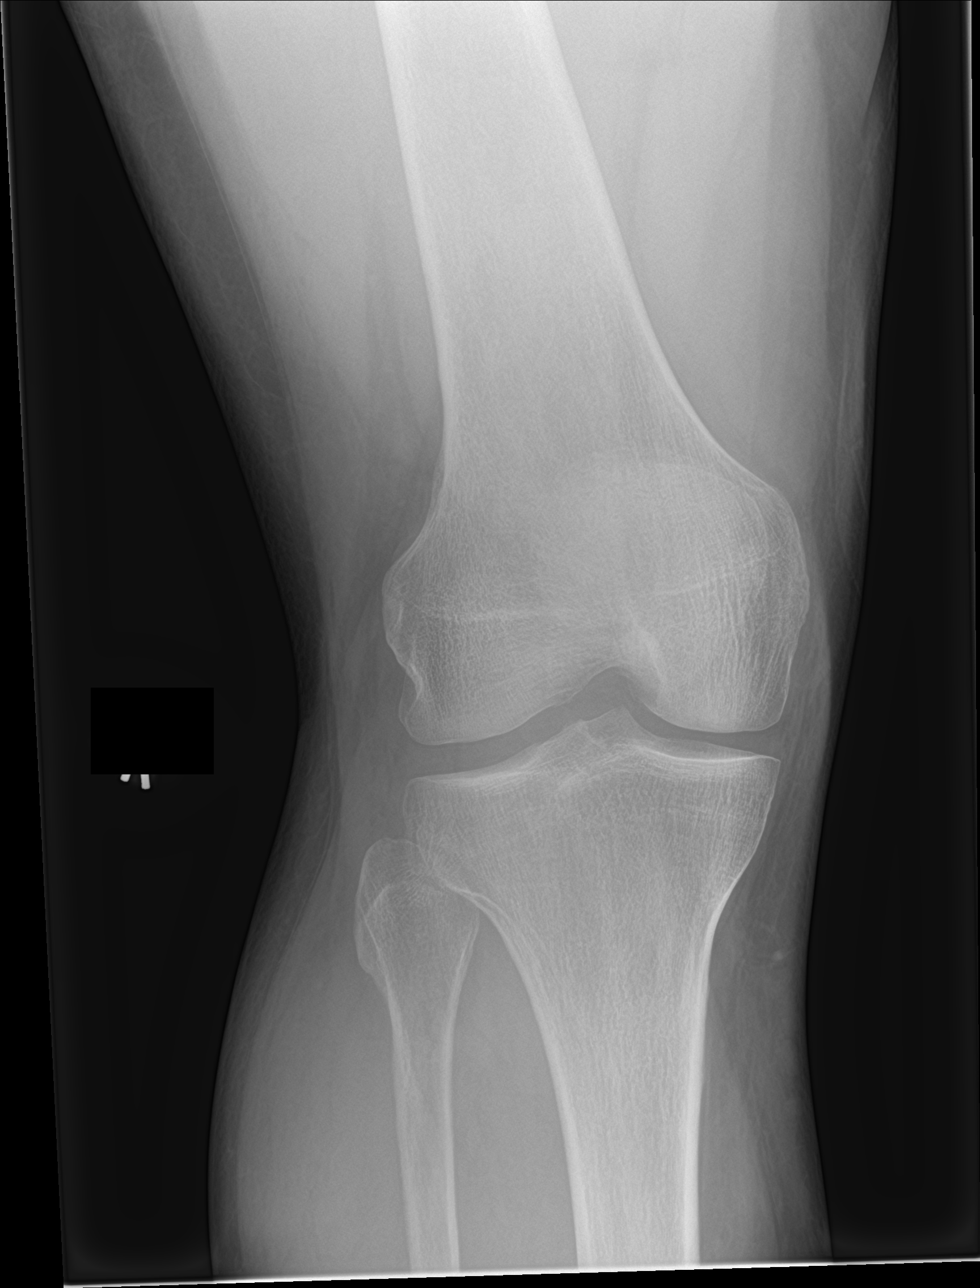

[knee obl (2 of 2)]
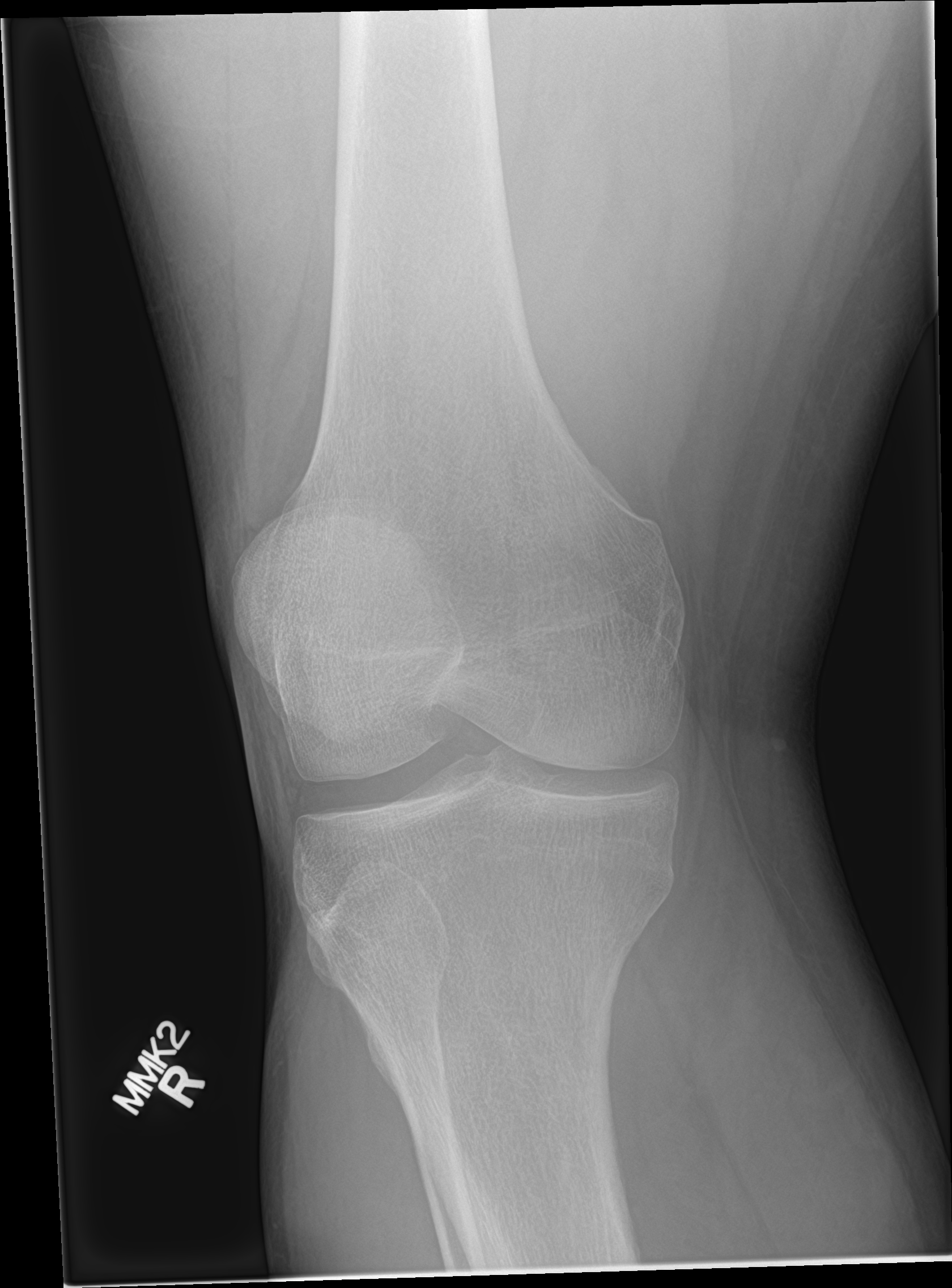

[4 of 4 positions shown; findings below may reference images not displayed]

FINDINGS: No evidence of fracture, dislocation, or joint effusion. No evidence
of arthropathy or other focal bone abnormality. Soft tissues are
unremarkable.
IMPRESSION: Negative.

## 2020-09-05 ENCOUNTER — Encounter: Payer: Self-pay | Admitting: Adult Health

## 2020-09-05 ENCOUNTER — Ambulatory Visit (INDEPENDENT_AMBULATORY_CARE_PROVIDER_SITE_OTHER): Payer: Medicare Other | Admitting: Adult Health

## 2020-09-05 ENCOUNTER — Other Ambulatory Visit: Payer: Self-pay

## 2020-09-05 DIAGNOSIS — F3110 Bipolar disorder, current episode manic without psychotic features, unspecified: Secondary | ICD-10-CM

## 2020-09-05 DIAGNOSIS — F4312 Post-traumatic stress disorder, chronic: Secondary | ICD-10-CM | POA: Diagnosis not present

## 2020-09-05 DIAGNOSIS — F5081 Binge eating disorder: Secondary | ICD-10-CM

## 2020-09-05 DIAGNOSIS — F902 Attention-deficit hyperactivity disorder, combined type: Secondary | ICD-10-CM | POA: Diagnosis not present

## 2020-09-05 DIAGNOSIS — F50819 Binge eating disorder, unspecified: Secondary | ICD-10-CM

## 2020-09-05 MED ORDER — ALPRAZOLAM 1 MG PO TABS: 1.0000 mg | ORAL_TABLET | Freq: Two times a day (BID) | ORAL | 2 refills | Status: DC | PRN

## 2020-09-05 MED ORDER — DIVALPROEX SODIUM ER 500 MG PO TB24: 2000.0000 mg | ORAL_TABLET | Freq: Every day | ORAL | 5 refills | Status: DC

## 2020-09-05 MED ORDER — LAMOTRIGINE 150 MG PO TABS: 300.0000 mg | ORAL_TABLET | Freq: Every day | ORAL | 1 refills | Status: DC

## 2020-09-05 NOTE — Progress Notes (Signed)
Paul Simmons [id number] 12/01/1980 40 y.o.  Subjective:   Patient ID:  Paul Simmons is a 40 y.o. (DOB [birth year]) male.  Chief Complaint: No chief complaint on file.   HPI Paul Simmons presents to the office today for follow-up of ADHD, Binge eating, BPD-1, PTSD, and high risk medication use.  Describes mood today as "ok". Pleasant. Mood symptoms - reports "some" depression, anxiety, and irritability at times. Stating "I'm doing good". Feels like medications continue to work well. Recovered from surgery. Stable interest and motivation. Taking medications as prescribed.  Energy levels stable. Active, does not have a regular exercise routine.  Enjoys some usual interests and activities. Married. Lives with wife of 6 years and 1 dog - Engineer, structural". Family in area. Spending time with family. Appetite adequate. Weight - 284 pounds. Sleeps better some nights than others. Averages 10 to 12 total hours. Napping some during the day. Focus and concentration stable. Completing tasks. Managing aspects of household. Disabled since 2008. Denies SI or HI.  Denies AH or VH. Consumes ETOH  Previous medication trials: Unknown  Review of Systems:  Review of Systems  Musculoskeletal:  Negative for gait problem.  Neurological:  Negative for tremors.  Psychiatric/Behavioral:         Please refer to HPI   Medications: I have reviewed the patient's current medications.  Current Outpatient Medications  Medication Sig Dispense Refill   ALPRAZolam (XANAX) 1 MG tablet Take 1 tablet (1 mg total) by mouth 2 (two) times daily as needed for anxiety or sleep. 60 tablet 2   aspirin EC 81 MG tablet Take 1 tablet (81 mg total) by mouth 2 (two) times daily. 28 tablet 0   divalproex (DEPAKOTE ER) 500 MG 24 hr tablet Take 4 tablets (2,000 mg total) by mouth at bedtime. 120 tablet 5   lamoTRIgine (LAMICTAL) 150 MG tablet Take 2 tablets (300 mg total) by mouth at bedtime. 180 tablet 1   levothyroxine (SYNTHROID,  LEVOTHROID) 50 MCG tablet Take 50 mcg by mouth daily before breakfast.     senna (SENOKOT) 8.6 MG TABS tablet Take 2 tablets (17.2 mg total) by mouth 2 (two) times daily. 30 tablet 0   No current facility-administered medications for this visit.    Medication Side Effects: None  Allergies: No Known Allergies  Past Medical History:  Diagnosis Date   Bipolar 1 disorder ([place])    Bipolar disorder ([place]) 05/14/2016   Bipolar I disorder, most recent episode (or current) manic ([place]) 01/22/2018   Chronic cholecystitis with calculus 05/14/2016   Hyperlipidemia    Hypothyroidism    OSA (obstructive sleep apnea) 05/14/2016   He has a CPAP machine but does not use it.   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   Please see review of systems for further details on the patient's review from today.   Objective:   Physical Exam:  There were no vitals taken for this visit.  Physical Exam Constitutional:      General: He is not in acute distress. Musculoskeletal:        General: No deformity.  Neurological:     Mental Status: He is alert and oriented to person, place, and time.     Coordination: Coordination normal.  Psychiatric:        Attention and Perception: Attention and perception normal. He does not  perceive auditory or visual hallucinations.        Mood and Affect: Mood normal. Mood is not anxious or depressed. Affect is not labile, blunt, angry or inappropriate.        Speech: Speech normal.        Behavior: Behavior normal.        Thought Content: Thought content normal. Thought content is not paranoid or delusional. Thought content does not include homicidal or suicidal ideation. Thought content does not include homicidal or suicidal plan.        Cognition and Memory: Cognition and memory normal.        Judgment:  Judgment normal.     Comments: Insight intact    Lab Review:     Component Value Date/Time   NA 134 (L) 05/23/2018 1842   K 3.5 05/23/2018 1842   CL 99 05/23/2018 1842   CO2 25 05/23/2018 1842   GLUCOSE 92 05/23/2018 1842   BUN 8 05/23/2018 1842   CREATININE 1.36 (H) 05/23/2018 1842   CALCIUM 9.2 05/23/2018 1842   PROT 6.6 05/16/2016 0511   ALBUMIN 3.8 05/16/2016 0511   AST 39 05/16/2016 0511   ALT 55 05/16/2016 0511   ALKPHOS 52 05/16/2016 0511   BILITOT 0.6 05/16/2016 0511   GFRNONAA >60 05/23/2018 1842   GFRAA >60 05/23/2018 1842       Component Value Date/Time   WBC 5.4 05/23/2018 1842   RBC 5.29 05/23/2018 1842   HGB 15.4 05/23/2018 1842   HCT 45.8 05/23/2018 1842   PLT 182 05/23/2018 1842   MCV 86.6 05/23/2018 1842   MCH 29.1 05/23/2018 1842   MCHC 33.6 05/23/2018 1842   RDW 13.0 05/23/2018 1842   LYMPHSABS 1.7 05/23/2018 1842   MONOABS 0.7 05/23/2018 1842   EOSABS 0.1 05/23/2018 1842   BASOSABS 0.0 05/23/2018 1842    No results found for: POCLITH, LITHIUM   Lab Results  Component Value Date   VALPROATE 57.9 12/29/2007     .res Assessment: Plan:    Plan:  PDMP reviewed  1. Lamictal '150mg'$  - 2 at bedtime. 2. Depakote ER '500mg'$  - 4 at bedtime 3. Xanax '1mg'$  BID  Read and reviewed note with patient for accuracy.   RTC 4 weeks  Patient advised to contact office with any questions, adverse effects, or acute worsening in signs and symptoms.  Counseled patient regarding potential benefits, risks, and side effects of Lamictal to include potential risk of Stevens-Johnson syndrome. Advised patient to stop taking Lamictal and contact office immediately if rash develops and to seek urgent medical attention if rash is severe and/or spreading quickly.    Diagnoses and all orders for this visit:  Chronic post-traumatic stress disorder -     lamoTRIgine (LAMICTAL) 150 MG tablet; Take 2 tablets (300 mg total) by mouth at bedtime. -     divalproex (DEPAKOTE  ER) 500 MG 24 hr tablet; Take 4 tablets (2,000 mg total) by mouth at bedtime. -     ALPRAZolam (XANAX) 1 MG tablet; Take 1 tablet (1 mg total) by mouth 2 (two) times daily as needed for anxiety or sleep.  Bipolar I disorder, most recent episode (or current) manic (HCC) -     lamoTRIgine (LAMICTAL) 150 MG tablet; Take 2 tablets (300 mg total) by mouth at bedtime. -     divalproex (DEPAKOTE ER) 500 MG 24 hr tablet; Take 4 tablets (2,000 mg total) by mouth at bedtime.  Attention deficit hyperactivity disorder, combined type  Binge  eating disorder    Please see After Visit Summary for patient specific instructions.  Future Appointments  Date Time Provider [place]  12/06/2020 10:00 AM Glyn Zendejas, Berdie Ogren, NP CP-CP None    No orders of the defined types were placed in this encounter.   -------------------------------

## 2020-10-11 DIAGNOSIS — R109 Unspecified abdominal pain: Secondary | ICD-10-CM | POA: Diagnosis not present

## 2020-10-16 DIAGNOSIS — R109 Unspecified abdominal pain: Secondary | ICD-10-CM | POA: Diagnosis not present

## 2020-10-16 DIAGNOSIS — K4020 Bilateral inguinal hernia, without obstruction or gangrene, not specified as recurrent: Secondary | ICD-10-CM | POA: Diagnosis not present

## 2020-10-16 DIAGNOSIS — R1084 Generalized abdominal pain: Secondary | ICD-10-CM | POA: Diagnosis not present

## 2020-12-06 ENCOUNTER — Encounter: Payer: Self-pay | Admitting: Adult Health

## 2020-12-06 ENCOUNTER — Ambulatory Visit (INDEPENDENT_AMBULATORY_CARE_PROVIDER_SITE_OTHER): Payer: Medicare Other | Admitting: Adult Health

## 2020-12-06 ENCOUNTER — Other Ambulatory Visit: Payer: Self-pay

## 2020-12-06 DIAGNOSIS — F3110 Bipolar disorder, current episode manic without psychotic features, unspecified: Secondary | ICD-10-CM | POA: Diagnosis not present

## 2020-12-06 DIAGNOSIS — F902 Attention-deficit hyperactivity disorder, combined type: Secondary | ICD-10-CM | POA: Diagnosis not present

## 2020-12-06 DIAGNOSIS — F5081 Binge eating disorder: Secondary | ICD-10-CM | POA: Diagnosis not present

## 2020-12-06 DIAGNOSIS — Z79899 Other long term (current) drug therapy: Secondary | ICD-10-CM

## 2020-12-06 DIAGNOSIS — F4312 Post-traumatic stress disorder, chronic: Secondary | ICD-10-CM | POA: Diagnosis not present

## 2020-12-06 MED ORDER — DIVALPROEX SODIUM ER 500 MG PO TB24: 2000.0000 mg | ORAL_TABLET | Freq: Every day | ORAL | 5 refills | Status: DC

## 2020-12-06 MED ORDER — LAMOTRIGINE 150 MG PO TABS: 300.0000 mg | ORAL_TABLET | Freq: Every day | ORAL | 1 refills | Status: DC

## 2020-12-06 MED ORDER — ALPRAZOLAM 1 MG PO TABS: 1.0000 mg | ORAL_TABLET | Freq: Two times a day (BID) | ORAL | 2 refills | Status: DC | PRN

## 2020-12-06 NOTE — Progress Notes (Signed)
Paul Simmons [id number] [birth year] 40 y.o.  Subjective:   Patient ID:  Paul Simmons is a 40 y.o. (DOB 1980/05/26) male.  Chief Complaint: No chief complaint on file.   HPI Paul Simmons presents to the office today for follow-up of ADHD, Binge eating, BPD-1, PTSD, and high risk medication use.  Describes mood today as "ok". Pleasant. Mood symptoms - reports depression, anxiety, and irritability. More irritability overall. Has been struggling with mood inconsistency over the past few weeks. Stating "I'm not doing too good right now". Unable to identify a precipitant - has not been sleeping as well. 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 Working some days 12 or so hours - and not sleeping as much as he was previously. Reports stopping medications for a period of time and then restarted. Plans to take more consistently. Stable interest and motivation. Taking medications as prescribed.  Energy levels stable. Active, does not have a regular exercise routine.  Enjoys some usual interests and activities. Married. Lives with wife of 6 years and 1 dog - Engineer, structural". Family in area. Spending time with family. Appetite adequate. Weight gain 4 pounds - 289 pounds. Sleeps better some nights than others. Averages 7 to 8 total hours versus 10 to 12 hours.  Focus and concentration stable. Completing tasks. Managing aspects of household. Disabled since 2008. Denies SI or HI.  Denies AH or VH. Consumes ETOH  Previous medication trials: Unknown     Review of Systems:  Review of Systems  Musculoskeletal:  Negative for gait problem.  Neurological:  Negative for tremors.  Psychiatric/Behavioral:         Please refer to HPI   Medications: I have reviewed the patient's current medications.  Current Outpatient Medications  Medication Sig Dispense Refill   ALPRAZolam (XANAX) 1 MG tablet Take 1 tablet (1 mg total) by mouth 2 (two)  times daily as needed for anxiety or sleep. 60 tablet 2   aspirin EC 81 MG tablet Take 1 tablet (81 mg total) by mouth 2 (two) times daily. 28 tablet 0   divalproex (DEPAKOTE ER) 500 MG 24 hr tablet Take 4 tablets (2,000 mg total) by mouth at bedtime. 120 tablet 5   lamoTRIgine (LAMICTAL) 150 MG tablet Take 2 tablets (300 mg total) by mouth at bedtime. 180 tablet 1   levothyroxine (SYNTHROID, LEVOTHROID) 50 MCG tablet Take 50 mcg by mouth daily before breakfast.     senna (SENOKOT) 8.6 MG TABS tablet Take 2 tablets (17.2 mg total) by mouth 2 (two) times daily. 30 tablet 0   No current facility-administered medications for this visit.    Medication Side Effects: None  Allergies: No Known Allergies  Past Medical History:  Diagnosis Date   Bipolar 1 disorder ([place])    Bipolar disorder ([place]) 05/14/2016   Bipolar I disorder, most recent episode (or current) manic ([place]) 01/22/2018   Chronic cholecystitis with calculus 05/14/2016   Hyperlipidemia    Hypothyroidism    OSA (obstructive sleep apnea) 05/14/2016   He has a CPAP machine but does not use it.   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   Please see review of systems for further details on the patient's review from today.   Objective:   Physical Exam:  There  were no vitals taken for this visit.  Physical Exam Constitutional:      General: He is not in acute distress. Musculoskeletal:        General: No deformity.  Neurological:     Mental Status: He is alert and oriented to person, place, and time.     Coordination: Coordination normal.  Psychiatric:        Attention and Perception: Attention and perception normal. He does not perceive auditory or visual hallucinations.        Mood and Affect: Mood normal. Mood is not anxious or depressed. Affect is not labile,  blunt, angry or inappropriate.        Speech: Speech normal.        Behavior: Behavior normal.        Thought Content: Thought content normal. Thought content is not paranoid or delusional. Thought content does not include homicidal or suicidal ideation. Thought content does not include homicidal or suicidal plan.        Cognition and Memory: Cognition and memory normal.        Judgment: Judgment normal.     Comments: Insight intact    Lab Review:     Component Value Date/Time   NA 134 (L) 05/23/2018 1842   K 3.5 05/23/2018 1842   CL 99 05/23/2018 1842   CO2 25 05/23/2018 1842   GLUCOSE 92 05/23/2018 1842   BUN 8 05/23/2018 1842   CREATININE 1.36 (H) 05/23/2018 1842   CALCIUM 9.2 05/23/2018 1842   PROT 6.6 05/16/2016 0511   ALBUMIN 3.8 05/16/2016 0511   AST 39 05/16/2016 0511   ALT 55 05/16/2016 0511   ALKPHOS 52 05/16/2016 0511   BILITOT 0.6 05/16/2016 0511   GFRNONAA >60 05/23/2018 1842   GFRAA >60 05/23/2018 1842       Component Value Date/Time   WBC 5.4 05/23/2018 1842   RBC 5.29 05/23/2018 1842   HGB 15.4 05/23/2018 1842   HCT 45.8 05/23/2018 1842   PLT 182 05/23/2018 1842   MCV 86.6 05/23/2018 1842   MCH 29.1 05/23/2018 1842   MCHC 33.6 05/23/2018 1842   RDW 13.0 05/23/2018 1842   LYMPHSABS 1.7 05/23/2018 1842   MONOABS 0.7 05/23/2018 1842   EOSABS 0.1 05/23/2018 1842   BASOSABS 0.0 05/23/2018 1842    No results found for: POCLITH, LITHIUM   Lab Results  Component Value Date   VALPROATE 57.9 12/29/2007     .res Assessment: Plan:    Plan:  PDMP reviewed  1. Lamictal 150mg  - 2 at bedtime. 2. Depakote ER 500mg  - 4 at bedtime 3. Xanax 1mg  BID  Labs: Depakote and LFT   Previous lab did not confirm consistency - levels undetected.  RTC 4 weeks  Patient advised to contact office with any questions, adverse effects, or acute worsening in signs and symptoms.  Counseled patient regarding potential benefits, risks, and side effects of Lamictal to  include potential risk of Stevens-Johnson syndrome. Advised patient to stop taking Lamictal and contact office immediately if rash develops and to seek urgent medical attention if rash is severe and/or spreading quickly.    Diagnoses and all orders for this visit:  High risk medication use -     Valproic acid level -     Hepatic function panel  Chronic post-traumatic stress disorder -     lamoTRIgine (LAMICTAL) 150 MG tablet; Take 2 tablets (300 mg total) by mouth at bedtime. -     divalproex (DEPAKOTE ER) 500  MG 24 hr tablet; Take 4 tablets (2,000 mg total) by mouth at bedtime. -     ALPRAZolam (XANAX) 1 MG tablet; Take 1 tablet (1 mg total) by mouth 2 (two) times daily as needed for anxiety or sleep.  Bipolar I disorder, most recent episode (or current) manic ([facility]) -     lamoTRIgine (LAMICTAL) 150 MG tablet; Take 2 tablets (300 mg total) by mouth at bedtime. -     divalproex (DEPAKOTE ER) 500 MG 24 hr tablet; Take 4 tablets (2,000 mg total) by mouth at bedtime.  Binge eating disorder  Attention deficit hyperactivity disorder, combined type    Please see After Visit Summary for patient specific instructions.  Future Appointments  Date Time Provider [place]  01/02/2021  3:00 PM Keerstin Bjelland, Berdie Ogren, NP CP-CP None    Orders Placed This Encounter  Procedures   Valproic acid level   Hepatic function panel    -------------------------------

## 2020-12-19 DIAGNOSIS — I10 Essential (primary) hypertension: Secondary | ICD-10-CM | POA: Diagnosis not present

## 2020-12-19 DIAGNOSIS — Z0000 Encounter for general adult medical examination without abnormal findings: Secondary | ICD-10-CM | POA: Diagnosis not present

## 2020-12-19 DIAGNOSIS — E782 Mixed hyperlipidemia: Secondary | ICD-10-CM | POA: Diagnosis not present

## 2020-12-19 DIAGNOSIS — Z1331 Encounter for screening for depression: Secondary | ICD-10-CM | POA: Diagnosis not present

## 2020-12-19 DIAGNOSIS — F3131 Bipolar disorder, current episode depressed, mild: Secondary | ICD-10-CM | POA: Diagnosis not present

## 2020-12-19 DIAGNOSIS — E038 Other specified hypothyroidism: Secondary | ICD-10-CM | POA: Diagnosis not present

## 2021-01-02 ENCOUNTER — Ambulatory Visit: Payer: Medicare Other | Admitting: Adult Health

## 2021-01-04 DIAGNOSIS — Z79899 Other long term (current) drug therapy: Secondary | ICD-10-CM | POA: Diagnosis not present

## 2021-01-08 ENCOUNTER — Other Ambulatory Visit: Payer: Self-pay

## 2021-01-08 ENCOUNTER — Encounter: Payer: Self-pay | Admitting: Adult Health

## 2021-01-08 ENCOUNTER — Ambulatory Visit (INDEPENDENT_AMBULATORY_CARE_PROVIDER_SITE_OTHER): Payer: Medicare Other | Admitting: Adult Health

## 2021-01-08 DIAGNOSIS — F4312 Post-traumatic stress disorder, chronic: Secondary | ICD-10-CM

## 2021-01-08 DIAGNOSIS — F5081 Binge eating disorder: Secondary | ICD-10-CM | POA: Diagnosis not present

## 2021-01-08 DIAGNOSIS — F902 Attention-deficit hyperactivity disorder, combined type: Secondary | ICD-10-CM | POA: Diagnosis not present

## 2021-01-08 DIAGNOSIS — F3110 Bipolar disorder, current episode manic without psychotic features, unspecified: Secondary | ICD-10-CM

## 2021-01-08 NOTE — Progress Notes (Signed)
Paul Simmons [id number] 1980-11-15 40 y.o.  Subjective:   Patient ID:  Paul Simmons is a 40 y.o. (DOB May 27, 1980) male.  Chief Complaint: No chief complaint on file.   HPI  Paul Simmons presents to the office today for follow-up of ADHD, Binge eating, BPD-1, and PTSD.    Describes mood today as "ok". Pleasant. Mood symptoms - reports depression, anxiety, and irritability. Reports mood inconsistency. Stating "I'm not doing good right now". His aunt was recently diagnosed with terminal cancer and is not expected to live through the end of the year. He and family have brought aunt home to care for her. Stating "I'm focused on caring for my aunt right now". Has not participated in any volunteer activities. Stable interest and motivation. Taking medications as prescribed.  Energy levels stable. Active, does not have a regular exercise routine.  Enjoys some usual interests and activities. Married. Lives with wife and 1 dog - Engineer, structural". Family in area. Spending time with family. Appetite adequate. Weight stable - 289 pounds. Sleeps better some nights than others. Averages 7 to 8 hours.  Focus and concentration stable. Completing tasks. Managing aspects of household. Disabled since 2008. Denies SI or HI.  Denies AH or VH. Consumes ETOH  Previous medication trials: Unknown   Review of Systems:  Review of Systems  Musculoskeletal:  Negative for gait problem.  Neurological:  Negative for tremors.  Psychiatric/Behavioral:         Please refer to HPI   Medications: I have reviewed the patient's current medications.  Current Outpatient Medications  Medication Sig Dispense Refill   ALPRAZolam (XANAX) 1 MG tablet Take 1 tablet (1 mg total) by mouth 2 (two) times daily as needed for anxiety or sleep. 60 tablet 2   aspirin EC 81 MG tablet Take 1 tablet (81 mg total) by mouth 2 (two) times daily. 28 tablet 0   divalproex (DEPAKOTE ER) 500 MG 24 hr tablet Take 4 tablets (2,000 mg total)  by mouth at bedtime. 120 tablet 5   lamoTRIgine (LAMICTAL) 150 MG tablet Take 2 tablets (300 mg total) by mouth at bedtime. 180 tablet 1   levothyroxine (SYNTHROID, LEVOTHROID) 50 MCG tablet Take 50 mcg by mouth daily before breakfast.     senna (SENOKOT) 8.6 MG TABS tablet Take 2 tablets (17.2 mg total) by mouth 2 (two) times daily. 30 tablet 0   No current facility-administered medications for this visit.    Medication Side Effects: None  Allergies: No Known Allergies  Past Medical History:  Diagnosis Date   Bipolar 1 disorder ([place])    Bipolar disorder ([place]) 05/14/2016   Bipolar I disorder, most recent episode (or current) manic ([place]) 01/22/2018   Chronic cholecystitis with calculus 05/14/2016   Hyperlipidemia    Hypothyroidism    OSA (obstructive sleep apnea) 05/14/2016   He has a CPAP machine but does not use it.   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   Please see review of systems for further details on the patient's review from today.   Objective:   Physical Exam:  There were no vitals taken for this visit.  Physical Exam Constitutional:      General: He is not in acute distress. Musculoskeletal:        General: No deformity.  Neurological:     Mental Status: He is  alert and oriented to person, place, and time.     Coordination: Coordination normal.  Psychiatric:        Attention and Perception: Attention and perception normal. He does not perceive auditory or visual hallucinations.        Mood and Affect: Mood normal. Mood is not anxious or depressed. Affect is not labile, blunt, angry or inappropriate.        Speech: Speech normal.        Behavior: Behavior normal.        Thought Content: Thought content normal. Thought content is not paranoid or delusional. Thought content does not include  homicidal or suicidal ideation. Thought content does not include homicidal or suicidal plan.        Cognition and Memory: Cognition and memory normal.        Judgment: Judgment normal.     Comments: Insight intact    Lab Review:     Component Value Date/Time   NA 134 (L) 05/23/2018 1842   K 3.5 05/23/2018 1842   CL 99 05/23/2018 1842   CO2 25 05/23/2018 1842   GLUCOSE 92 05/23/2018 1842   BUN 8 05/23/2018 1842   CREATININE 1.36 (H) 05/23/2018 1842   CALCIUM 9.2 05/23/2018 1842   PROT 6.6 05/16/2016 0511   ALBUMIN 3.8 05/16/2016 0511   AST 39 05/16/2016 0511   ALT 55 05/16/2016 0511   ALKPHOS 52 05/16/2016 0511   BILITOT 0.6 05/16/2016 0511   GFRNONAA >60 05/23/2018 1842   GFRAA >60 05/23/2018 1842       Component Value Date/Time   WBC 5.4 05/23/2018 1842   RBC 5.29 05/23/2018 1842   HGB 15.4 05/23/2018 1842   HCT 45.8 05/23/2018 1842   PLT 182 05/23/2018 1842   MCV 86.6 05/23/2018 1842   MCH 29.1 05/23/2018 1842   MCHC 33.6 05/23/2018 1842   RDW 13.0 05/23/2018 1842   LYMPHSABS 1.7 05/23/2018 1842   MONOABS 0.7 05/23/2018 1842   EOSABS 0.1 05/23/2018 1842   BASOSABS 0.0 05/23/2018 1842    No results found for: POCLITH, LITHIUM   Lab Results  Component Value Date   VALPROATE 57.9 12/29/2007       .res Assessment: Plan:    Plan:  PDMP reviewed  1. Lamictal 150mg  - 2 at bedtime. 2. Depakote ER 500mg  - 4 at bedtime 3. Xanax 1mg  BID  01/04/2021  Depakote 51.6 - 50-100 LFT - WNL  RTC 8 weeks  Patient advised to contact office with any questions, adverse effects, or acute worsening in signs and symptoms.  Discussed potential benefits, risk, and side effects of benzodiazepines to include potential risk of tolerance and dependence, as well as possible drowsiness.  Advised patient not to drive if experiencing drowsiness and to take lowest possible effective dose to minimize risk of dependence and tolerance.   Counseled patient regarding potential  benefits, risks, and side effects of Lamictal to include potential risk of Stevens-Johnson syndrome. Advised patient to stop taking Lamictal and contact office immediately if rash develops and to seek urgent medical attention if rash is severe and/or spreading quickly.   Diagnoses and all orders for this visit:  Chronic post-traumatic stress disorder  Bipolar I disorder, most recent episode (or current) manic ([place])  Binge eating disorder  Attention deficit hyperactivity disorder, combined type    Please see After Visit Summary for patient specific instructions.  No future appointments.  No orders of the defined types were placed in this encounter.   -------------------------------

## 2021-03-11 ENCOUNTER — Ambulatory Visit: Payer: Medicare Other | Admitting: Adult Health

## 2021-03-11 NOTE — Progress Notes (Signed)
Patient no show appointment. ? ?

## 2021-04-02 ENCOUNTER — Other Ambulatory Visit: Payer: Self-pay

## 2021-04-02 ENCOUNTER — Encounter: Payer: Self-pay | Admitting: Adult Health

## 2021-04-02 ENCOUNTER — Ambulatory Visit (INDEPENDENT_AMBULATORY_CARE_PROVIDER_SITE_OTHER): Payer: Medicare Other | Admitting: Adult Health

## 2021-04-02 DIAGNOSIS — F902 Attention-deficit hyperactivity disorder, combined type: Secondary | ICD-10-CM | POA: Diagnosis not present

## 2021-04-02 DIAGNOSIS — F4312 Post-traumatic stress disorder, chronic: Secondary | ICD-10-CM

## 2021-04-02 DIAGNOSIS — F3110 Bipolar disorder, current episode manic without psychotic features, unspecified: Secondary | ICD-10-CM | POA: Diagnosis not present

## 2021-04-02 DIAGNOSIS — F5081 Binge eating disorder: Secondary | ICD-10-CM

## 2021-04-02 MED ORDER — ALPRAZOLAM 1 MG PO TABS: 1.0000 mg | ORAL_TABLET | Freq: Two times a day (BID) | ORAL | 2 refills | Status: DC | PRN

## 2021-04-02 MED ORDER — LAMOTRIGINE 150 MG PO TABS: 300.0000 mg | ORAL_TABLET | Freq: Every day | ORAL | 1 refills | Status: DC

## 2021-04-02 MED ORDER — DIVALPROEX SODIUM ER 500 MG PO TB24: 2000.0000 mg | ORAL_TABLET | Freq: Every day | ORAL | 5 refills | Status: DC

## 2021-04-02 NOTE — Progress Notes (Signed)
Paul Simmons [id number] 1980-07-02 41 y.o.  Subjective:   Patient ID:  Paul Simmons is a 41 y.o. (DOB 02/24/1980) male.  Chief Complaint: No chief complaint on file.   HPI CADARIUS NEVARES presents to the office today for follow-up of ADHD, Binge eating, BPD-1 and PTSD.    Describes mood today as "ok". Pleasant. Mood symptoms - reports depression, anxiety, and irritability. Reports some mood instabilities. Stating "I'm doing alright". Lost 3 family members over the past few months 2 before Christmas and one after recent cruise - grieving the losses. Recent cruise - feeling more refreshed. He wife doing well. Stable interest and motivation. Taking medications as prescribed.  Energy levels stable. Active, does not have a regular exercise routine.  Enjoys some usual interests and activities. Married. Lives with wife and 1 dog - Engineer, structural". Family in area. Spending time with family. Appetite adequate. Weight stable - 289 pounds. Sleeps better some nights than others. Averages 7 to 8 hours.  Focus and concentration stable. Completing tasks. Managing aspects of household. Disabled since 2008. Denies SI or HI.  Denies AH or VH. Consumes ETOH  Previous medication trials: Unknown     Review of Systems:  Review of Systems  Musculoskeletal:  Negative for gait problem.  Neurological:  Negative for tremors.  Psychiatric/Behavioral:         Please refer to HPI   Medications: I have reviewed the patient's current medications.  Current Outpatient Medications  Medication Sig Dispense Refill   ALPRAZolam (XANAX) 1 MG tablet Take 1 tablet (1 mg total) by mouth 2 (two) times daily as needed for anxiety or sleep. 60 tablet 2   aspirin EC 81 MG tablet Take 1 tablet (81 mg total) by mouth 2 (two) times daily. 28 tablet 0   divalproex (DEPAKOTE ER) 500 MG 24 hr tablet Take 4 tablets (2,000 mg total) by mouth at bedtime. 120 tablet 5   lamoTRIgine (LAMICTAL) 150 MG tablet Take 2 tablets (300 mg  total) by mouth at bedtime. 180 tablet 1   levothyroxine (SYNTHROID, LEVOTHROID) 50 MCG tablet Take 50 mcg by mouth daily before breakfast.     senna (SENOKOT) 8.6 MG TABS tablet Take 2 tablets (17.2 mg total) by mouth 2 (two) times daily. 30 tablet 0   No current facility-administered medications for this visit.    Medication Side Effects: None  Allergies: No Known Allergies  Past Medical History:  Diagnosis Date   Bipolar 1 disorder ([place])    Bipolar disorder ([place]) 05/14/2016   Bipolar I disorder, most recent episode (or current) manic ([place]) 01/22/2018   Chronic cholecystitis with calculus 05/14/2016   Hyperlipidemia    Hypothyroidism    OSA (obstructive sleep apnea) 05/14/2016   He has a CPAP machine but does not use it.   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   Please see review of systems for further details on the patient's review from today.   Objective:   Physical Exam:  There were no vitals taken for this visit.  Physical Exam Constitutional:      General: He is not in acute distress. Musculoskeletal:        General: No deformity.  Neurological:     Mental Status: He is alert and oriented to person, place, and time.     Coordination: Coordination normal.  Psychiatric:  Attention and Perception: Attention and perception normal. He does not perceive auditory or visual hallucinations.        Mood and Affect: Mood normal. Mood is not anxious or depressed. Affect is not labile, blunt, angry or inappropriate.        Speech: Speech normal.        Behavior: Behavior normal.        Thought Content: Thought content normal. Thought content is not paranoid or delusional. Thought content does not include homicidal or suicidal ideation. Thought content does not include homicidal or suicidal plan.         Cognition and Memory: Cognition and memory normal.        Judgment: Judgment normal.     Comments: Insight intact    Lab Review:     Component Value Date/Time   NA 134 (L) 05/23/2018 1842   K 3.5 05/23/2018 1842   CL 99 05/23/2018 1842   CO2 25 05/23/2018 1842   GLUCOSE 92 05/23/2018 1842   BUN 8 05/23/2018 1842   CREATININE 1.36 (H) 05/23/2018 1842   CALCIUM 9.2 05/23/2018 1842   PROT 6.6 05/16/2016 0511   ALBUMIN 3.8 05/16/2016 0511   AST 39 05/16/2016 0511   ALT 55 05/16/2016 0511   ALKPHOS 52 05/16/2016 0511   BILITOT 0.6 05/16/2016 0511   GFRNONAA >60 05/23/2018 1842   GFRAA >60 05/23/2018 1842       Component Value Date/Time   WBC 5.4 05/23/2018 1842   RBC 5.29 05/23/2018 1842   HGB 15.4 05/23/2018 1842   HCT 45.8 05/23/2018 1842   PLT 182 05/23/2018 1842   MCV 86.6 05/23/2018 1842   MCH 29.1 05/23/2018 1842   MCHC 33.6 05/23/2018 1842   RDW 13.0 05/23/2018 1842   LYMPHSABS 1.7 05/23/2018 1842   MONOABS 0.7 05/23/2018 1842   EOSABS 0.1 05/23/2018 1842   BASOSABS 0.0 05/23/2018 1842    No results found for: POCLITH, LITHIUM   Lab Results  Component Value Date   VALPROATE 57.9 12/29/2007     .res Assessment: Plan:    Plan:  PDMP reviewed  1. Lamictal 150mg  - 2 at bedtime. 2. Depakote ER 500mg  - 4 at bedtime 3. Xanax 1mg  BID  01/04/2021  Depakote 51.6 - 50-100 LFT - WNL  RTC 8 weeks  Patient advised to contact office with any questions, adverse effects, or acute worsening in signs and symptoms.  Discussed potential benefits, risk, and side effects of benzodiazepines to include potential risk of tolerance and dependence, as well as possible drowsiness.  Advised patient not to drive if experiencing drowsiness and to take lowest possible effective dose to minimize risk of dependence and tolerance.   Counseled patient regarding potential benefits, risks, and side effects of Lamictal to include potential risk of Stevens-Johnson syndrome. Advised  patient to stop taking Lamictal and contact office immediately if rash develops and to seek urgent medical attention if rash is severe and/or spreading quickly.   Diagnoses and all orders for this visit:  Binge eating disorder  Chronic post-traumatic stress disorder -     divalproex (DEPAKOTE ER) 500 MG 24 hr tablet; Take 4 tablets (2,000 mg total) by mouth at bedtime. -     lamoTRIgine (LAMICTAL) 150 MG tablet; Take 2 tablets (300 mg total) by mouth at bedtime. -     ALPRAZolam (XANAX) 1 MG tablet; Take 1 tablet (1 mg total) by mouth 2 (two) times daily as needed for anxiety or sleep.  Attention deficit  hyperactivity disorder, combined type  Bipolar I disorder, most recent episode (or current) manic ([facility]) -     divalproex (DEPAKOTE ER) 500 MG 24 hr tablet; Take 4 tablets (2,000 mg total) by mouth at bedtime. -     lamoTRIgine (LAMICTAL) 150 MG tablet; Take 2 tablets (300 mg total) by mouth at bedtime.     Please see After Visit Summary for patient specific instructions.  Future Appointments  Date Time Provider [place]  07/01/2021  9:40 AM Marques Ericson, Berdie Ogren, NP CP-CP None    No orders of the defined types were placed in this encounter.   -------------------------------

## 2021-04-17 DIAGNOSIS — E782 Mixed hyperlipidemia: Secondary | ICD-10-CM | POA: Diagnosis not present

## 2021-04-17 DIAGNOSIS — I10 Essential (primary) hypertension: Secondary | ICD-10-CM | POA: Diagnosis not present

## 2021-04-17 DIAGNOSIS — Z0000 Encounter for general adult medical examination without abnormal findings: Secondary | ICD-10-CM | POA: Diagnosis not present

## 2021-04-17 DIAGNOSIS — E038 Other specified hypothyroidism: Secondary | ICD-10-CM | POA: Diagnosis not present

## 2021-04-17 DIAGNOSIS — F3131 Bipolar disorder, current episode depressed, mild: Secondary | ICD-10-CM | POA: Diagnosis not present

## 2021-04-17 DIAGNOSIS — Z1331 Encounter for screening for depression: Secondary | ICD-10-CM | POA: Diagnosis not present

## 2021-04-24 ENCOUNTER — Other Ambulatory Visit (HOSPITAL_COMMUNITY): Payer: Self-pay

## 2021-04-29 DIAGNOSIS — J45909 Unspecified asthma, uncomplicated: Secondary | ICD-10-CM | POA: Diagnosis not present

## 2021-05-22 ENCOUNTER — Other Ambulatory Visit (HOSPITAL_COMMUNITY): Payer: Self-pay

## 2021-05-22 MED ORDER — PHENTERMINE HCL 37.5 MG PO CAPS
ORAL_CAPSULE | ORAL | 0 refills | Status: DC
  Filled 2021-05-22: qty 90, 90d supply, fill #0

## 2021-05-23 ENCOUNTER — Other Ambulatory Visit (HOSPITAL_COMMUNITY): Payer: Self-pay

## 2021-05-30 DIAGNOSIS — M25521 Pain in right elbow: Secondary | ICD-10-CM | POA: Diagnosis not present

## 2021-05-30 DIAGNOSIS — M7711 Lateral epicondylitis, right elbow: Secondary | ICD-10-CM | POA: Diagnosis not present

## 2021-07-01 ENCOUNTER — Encounter: Payer: Self-pay | Admitting: Adult Health

## 2021-07-01 ENCOUNTER — Ambulatory Visit (INDEPENDENT_AMBULATORY_CARE_PROVIDER_SITE_OTHER): Payer: Medicare Other | Admitting: Adult Health

## 2021-07-01 DIAGNOSIS — F5081 Binge eating disorder: Secondary | ICD-10-CM | POA: Diagnosis not present

## 2021-07-01 DIAGNOSIS — Z79899 Other long term (current) drug therapy: Secondary | ICD-10-CM

## 2021-07-01 DIAGNOSIS — F902 Attention-deficit hyperactivity disorder, combined type: Secondary | ICD-10-CM | POA: Diagnosis not present

## 2021-07-01 DIAGNOSIS — F3110 Bipolar disorder, current episode manic without psychotic features, unspecified: Secondary | ICD-10-CM

## 2021-07-01 DIAGNOSIS — F4312 Post-traumatic stress disorder, chronic: Secondary | ICD-10-CM

## 2021-07-01 MED ORDER — ALPRAZOLAM 1 MG PO TABS: 1.0000 mg | ORAL_TABLET | Freq: Two times a day (BID) | ORAL | 2 refills | Status: DC | PRN

## 2021-07-01 MED ORDER — DIVALPROEX SODIUM ER 500 MG PO TB24: 2000.0000 mg | ORAL_TABLET | Freq: Every day | ORAL | 5 refills | Status: DC

## 2021-07-01 MED ORDER — LAMOTRIGINE 150 MG PO TABS: 300.0000 mg | ORAL_TABLET | Freq: Every day | ORAL | 3 refills | Status: DC

## 2021-07-01 NOTE — Progress Notes (Signed)
Paul Simmons [id number] 07-27-1980 41 y.o.  Subjective:   Patient ID:  Paul Simmons is a 41 y.o. (DOB [birth year]) male.  Chief Complaint: No chief complaint on file.   HPI CYNCERE SONTAG presents to the office today for follow-up of ADHD, Binge eating, BPD-1 and PTSD.   Describes mood today as "ok". Pleasant. Mood symptoms - denies depression, anxiety, and irritability. Reports some mood instabilities - "gets aggravated at times". Stating "I'm doing great". Feels like medications continue to work well. He wife doing well. Upcoming family beach trip. Stable interest and motivation. Taking medications as prescribed.  Energy levels stable. Active, does not have a regular exercise routine.  Enjoys some usual interests and activities. Married. Lives with wife and 1 dog - Engineer, structural". Family in area. Spending time with family. Appetite adequate. Weight stable - 289 pounds. Sleeps better some nights than others. Averages 7 to 8 hours.  Focus and concentration stable. Completing tasks. Managing aspects of household. Disabled since 2008. Denies SI or HI.  Denies AH or VH. Consumes ETOH.  Previous medication trials: Unknown      Review of Systems:  Review of Systems  Musculoskeletal:  Negative for gait problem.  Neurological:  Negative for tremors.  Psychiatric/Behavioral:         Please refer to HPI   Medications: I have reviewed the patient's current medications.  Current Outpatient Medications  Medication Sig Dispense Refill   ALPRAZolam (XANAX) 1 MG tablet Take 1 tablet (1 mg total) by mouth 2 (two) times daily as needed for anxiety or sleep. 60 tablet 2   aspirin EC 81 MG tablet Take 1 tablet (81 mg total) by mouth 2 (two) times daily. 28 tablet 0   divalproex (DEPAKOTE ER) 500 MG 24 hr tablet Take 4 tablets (2,000 mg total) by mouth at bedtime. 120 tablet 5   lamoTRIgine (LAMICTAL) 150 MG tablet Take 2 tablets (300 mg total) by mouth at bedtime. 180 tablet 3    levothyroxine (SYNTHROID, LEVOTHROID) 50 MCG tablet Take 50 mcg by mouth daily before breakfast.     phentermine 37.5 MG capsule Take 1 capsule by mouth daily. 90 capsule 0   senna (SENOKOT) 8.6 MG TABS tablet Take 2 tablets (17.2 mg total) by mouth 2 (two) times daily. 30 tablet 0   No current facility-administered medications for this visit.    Medication Side Effects: None  Allergies: No Known Allergies  Past Medical History:  Diagnosis Date   Bipolar 1 disorder ([place])    Bipolar disorder ([place]) 05/14/2016   Bipolar I disorder, most recent episode (or current) manic ([place]) 01/22/2018   Chronic cholecystitis with calculus 05/14/2016   Hyperlipidemia    Hypothyroidism    OSA (obstructive sleep apnea) 05/14/2016   He has a CPAP machine but does not use it.   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   Please see review of systems for further details on the patient's review from today.   Objective:   Physical Exam:  There were no vitals taken for this visit.  Physical Exam Constitutional:      General: He is not in acute distress. Musculoskeletal:        General: No deformity.  Neurological:     Mental Status: He is alert and oriented to person, place, and time.     Coordination: Coordination  normal.  Psychiatric:        Attention and Perception: Attention and perception normal. He does not perceive auditory or visual hallucinations.        Mood and Affect: Mood normal. Mood is not anxious or depressed. Affect is not labile, blunt, angry or inappropriate.        Speech: Speech normal.        Behavior: Behavior normal.        Thought Content: Thought content normal. Thought content is not paranoid or delusional. Thought content does not include homicidal or suicidal ideation. Thought content does not include  homicidal or suicidal plan.        Cognition and Memory: Cognition and memory normal.        Judgment: Judgment normal.     Comments: Insight intact    Lab Review:     Component Value Date/Time   NA 134 (L) 05/23/2018 1842   K 3.5 05/23/2018 1842   CL 99 05/23/2018 1842   CO2 25 05/23/2018 1842   GLUCOSE 92 05/23/2018 1842   BUN 8 05/23/2018 1842   CREATININE 1.36 (H) 05/23/2018 1842   CALCIUM 9.2 05/23/2018 1842   PROT 6.6 05/16/2016 0511   ALBUMIN 3.8 05/16/2016 0511   AST 39 05/16/2016 0511   ALT 55 05/16/2016 0511   ALKPHOS 52 05/16/2016 0511   BILITOT 0.6 05/16/2016 0511   GFRNONAA >60 05/23/2018 1842   GFRAA >60 05/23/2018 1842       Component Value Date/Time   WBC 5.4 05/23/2018 1842   RBC 5.29 05/23/2018 1842   HGB 15.4 05/23/2018 1842   HCT 45.8 05/23/2018 1842   PLT 182 05/23/2018 1842   MCV 86.6 05/23/2018 1842   MCH 29.1 05/23/2018 1842   MCHC 33.6 05/23/2018 1842   RDW 13.0 05/23/2018 1842   LYMPHSABS 1.7 05/23/2018 1842   MONOABS 0.7 05/23/2018 1842   EOSABS 0.1 05/23/2018 1842   BASOSABS 0.0 05/23/2018 1842    No results found for: POCLITH, LITHIUM   Lab Results  Component Value Date   VALPROATE 57.9 12/29/2007     .res Assessment: Plan:    Plan:  PDMP reviewed  1. Lamictal '150mg'$  - 2 at bedtime. 2. Depakote ER '500mg'$  - 4 at bedtime 3. Xanax '1mg'$  BID  01/04/2021  Depakote 51.6 - 50-100 LFT - WNL  RTC 3 months  Patient advised to contact office with any questions, adverse effects, or acute worsening in signs and symptoms.  Discussed potential benefits, risk, and side effects of benzodiazepines to include potential risk of tolerance and dependence, as well as possible drowsiness.  Advised patient not to drive if experiencing drowsiness and to take lowest possible effective dose to minimize risk of dependence and tolerance.   Counseled patient regarding potential benefits, risks, and side effects of Lamictal to include potential risk  of Stevens-Johnson syndrome. Advised patient to stop taking Lamictal and contact office immediately if rash develops and to seek urgent medical attention if rash is severe and/or spreading quickly.   Diagnoses and all orders for this visit:  Attention deficit hyperactivity disorder, combined type  Chronic post-traumatic stress disorder -     ALPRAZolam (XANAX) 1 MG tablet; Take 1 tablet (1 mg total) by mouth 2 (two) times daily as needed for anxiety or sleep. -     divalproex (DEPAKOTE ER) 500 MG 24 hr tablet; Take 4 tablets (2,000 mg total) by mouth at bedtime. -     lamoTRIgine (LAMICTAL) 150 MG tablet;  Take 2 tablets (300 mg total) by mouth at bedtime.  Bipolar I disorder, most recent episode (or current) manic (HCC) -     divalproex (DEPAKOTE ER) 500 MG 24 hr tablet; Take 4 tablets (2,000 mg total) by mouth at bedtime. -     lamoTRIgine (LAMICTAL) 150 MG tablet; Take 2 tablets (300 mg total) by mouth at bedtime.  Binge eating disorder  High risk medication use -     Valproic acid level -     CMP and Liver     Please see After Visit Summary for patient specific instructions.  Future Appointments  Date Time Provider [place]  10/01/2021  9:40 AM Savannha Welle, Berdie Ogren, NP CP-CP None    Orders Placed This Encounter  Procedures   Valproic acid level   CMP and Liver    -------------------------------

## 2021-07-18 DIAGNOSIS — M25572 Pain in left ankle and joints of left foot: Secondary | ICD-10-CM | POA: Diagnosis not present

## 2021-07-18 DIAGNOSIS — M7711 Lateral epicondylitis, right elbow: Secondary | ICD-10-CM | POA: Diagnosis not present

## 2021-08-09 DIAGNOSIS — M79672 Pain in left foot: Secondary | ICD-10-CM | POA: Diagnosis not present

## 2021-08-09 DIAGNOSIS — S93622A Sprain of tarsometatarsal ligament of left foot, initial encounter: Secondary | ICD-10-CM | POA: Diagnosis not present

## 2021-08-19 DIAGNOSIS — M79672 Pain in left foot: Secondary | ICD-10-CM | POA: Diagnosis not present

## 2021-08-26 DIAGNOSIS — S93622A Sprain of tarsometatarsal ligament of left foot, initial encounter: Secondary | ICD-10-CM | POA: Diagnosis not present

## 2021-08-29 DIAGNOSIS — M7711 Lateral epicondylitis, right elbow: Secondary | ICD-10-CM | POA: Diagnosis not present

## 2021-08-29 DIAGNOSIS — S93622A Sprain of tarsometatarsal ligament of left foot, initial encounter: Secondary | ICD-10-CM | POA: Diagnosis not present

## 2021-08-29 DIAGNOSIS — M25521 Pain in right elbow: Secondary | ICD-10-CM | POA: Diagnosis not present

## 2021-09-04 DIAGNOSIS — N182 Chronic kidney disease, stage 2 (mild): Secondary | ICD-10-CM | POA: Diagnosis not present

## 2021-09-04 DIAGNOSIS — J45909 Unspecified asthma, uncomplicated: Secondary | ICD-10-CM | POA: Diagnosis not present

## 2021-09-04 DIAGNOSIS — F3131 Bipolar disorder, current episode depressed, mild: Secondary | ICD-10-CM | POA: Diagnosis not present

## 2021-09-04 DIAGNOSIS — I10 Essential (primary) hypertension: Secondary | ICD-10-CM | POA: Diagnosis not present

## 2021-09-06 DIAGNOSIS — S93622D Sprain of tarsometatarsal ligament of left foot, subsequent encounter: Secondary | ICD-10-CM | POA: Diagnosis not present

## 2021-09-10 ENCOUNTER — Telehealth: Payer: Self-pay | Admitting: Adult Health

## 2021-09-10 NOTE — Telephone Encounter (Signed)
Ok great, ty.

## 2021-09-10 NOTE — Telephone Encounter (Signed)
PT LVM @ 2:15p.  He just said Paul Simmons wanted him to get labs.  I don't know what his question was....ugh!  Next appt 8/22

## 2021-09-10 NOTE — Telephone Encounter (Signed)
Pt stated he got labs drawn at PCP,I gave him the fax number so they can send it to [place]

## 2021-09-16 DIAGNOSIS — S93622A Sprain of tarsometatarsal ligament of left foot, initial encounter: Secondary | ICD-10-CM | POA: Diagnosis not present

## 2021-10-01 ENCOUNTER — Ambulatory Visit (INDEPENDENT_AMBULATORY_CARE_PROVIDER_SITE_OTHER): Payer: Medicare Other | Admitting: Adult Health

## 2021-10-01 ENCOUNTER — Encounter: Payer: Self-pay | Admitting: Adult Health

## 2021-10-01 DIAGNOSIS — F902 Attention-deficit hyperactivity disorder, combined type: Secondary | ICD-10-CM | POA: Diagnosis not present

## 2021-10-01 DIAGNOSIS — F3110 Bipolar disorder, current episode manic without psychotic features, unspecified: Secondary | ICD-10-CM

## 2021-10-01 DIAGNOSIS — F5081 Binge eating disorder: Secondary | ICD-10-CM

## 2021-10-01 DIAGNOSIS — Z79899 Other long term (current) drug therapy: Secondary | ICD-10-CM

## 2021-10-01 DIAGNOSIS — F4312 Post-traumatic stress disorder, chronic: Secondary | ICD-10-CM | POA: Diagnosis not present

## 2021-10-01 MED ORDER — ALPRAZOLAM 1 MG PO TABS
1.0000 mg | ORAL_TABLET | Freq: Two times a day (BID) | ORAL | 2 refills | Status: DC | PRN
Start: 2021-10-01 — End: 2022-01-01

## 2021-10-01 MED ORDER — LAMOTRIGINE 150 MG PO TABS: 300.0000 mg | ORAL_TABLET | Freq: Every day | ORAL | 3 refills | Status: DC

## 2021-10-01 MED ORDER — DIVALPROEX SODIUM ER 500 MG PO TB24: 2000.0000 mg | ORAL_TABLET | Freq: Every day | ORAL | 5 refills | Status: DC

## 2021-10-01 NOTE — Progress Notes (Signed)
DECKARD STUBER [id number] Jul 05, 1980 41 y.o.  Subjective:   Patient ID:  Paul Simmons is a 41 y.o. (DOB Nov 24, 1980) male.  Chief Complaint: No chief complaint on file.   HPI Paul Simmons presents to the office today for follow-up of  ADHD, Binge eating, BPD-1 and PTSD.   Describes mood today as "ok". Pleasant. Mood symptoms - denies depression and anxiety. Feels irritable at times - frustrated. Mood fluctuates - ups and downs. Stating "I'm doing alright". Feels like medications continue to work well. Situational stressors - recovering from foot injury - using assistive device. He and wife doing well. Stable interest and motivation. Taking medications as prescribed.  Energy levels stable. Active, does not have a regular exercise routine.  Enjoys some usual interests and activities. Married. Lives with wife and 1 dog - Engineer, structural". Family in area. Spending time with family. Appetite adequate. Weight gain with limited mobility - 290 pounds. Sleeps better some nights than others. Averages 12 hours.  Focus and concentration stable. Completing tasks. Managing aspects of household. Disabled since 2008. Denies SI or HI.  Denies AH or VH. Consumes ETOH.  Previous medication trials: Unknown   Review of Systems:  Review of Systems  Musculoskeletal:  Negative for gait problem.  Neurological:  Negative for tremors.  Psychiatric/Behavioral:         Please refer to HPI    Medications: I have reviewed the patient's current medications.  Current Outpatient Medications  Medication Sig Dispense Refill   ALPRAZolam (XANAX) 1 MG tablet Take 1 tablet (1 mg total) by mouth 2 (two) times daily as needed for anxiety or sleep. 60 tablet 2   aspirin EC 81 MG tablet Take 1 tablet (81 mg total) by mouth 2 (two) times daily. 28 tablet 0   divalproex (DEPAKOTE ER) 500 MG 24 hr tablet Take 4 tablets (2,000 mg total) by mouth at bedtime. 120 tablet 5   lamoTRIgine (LAMICTAL) 150 MG tablet Take 2 tablets  (300 mg total) by mouth at bedtime. 180 tablet 3   levothyroxine (SYNTHROID, LEVOTHROID) 50 MCG tablet Take 50 mcg by mouth daily before breakfast.     phentermine 37.5 MG capsule Take 1 capsule by mouth daily. 90 capsule 0   senna (SENOKOT) 8.6 MG TABS tablet Take 2 tablets (17.2 mg total) by mouth 2 (two) times daily. 30 tablet 0   No current facility-administered medications for this visit.    Medication Side Effects: None  Allergies: No Known Allergies  Past Medical History:  Diagnosis Date   Bipolar 1 disorder ([place])    Bipolar disorder ([place]) 05/14/2016   Bipolar I disorder, most recent episode (or current) manic ([place]) 01/22/2018   Chronic cholecystitis with calculus 05/14/2016   Hyperlipidemia    Hypothyroidism    OSA (obstructive sleep apnea) 05/14/2016   He has a CPAP machine but does not use it.   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   Please see review of systems for further details on the patient's review from today.   Objective:   Physical Exam:  There were no vitals taken for this visit.  Physical Exam Constitutional:      General: He is not in acute distress. Musculoskeletal:        General: No deformity.  Neurological:     Mental Status: He is alert and oriented to  person, place, and time.     Coordination: Coordination normal.  Psychiatric:        Attention and Perception: Attention and perception normal. He does not perceive auditory or visual hallucinations.        Mood and Affect: Mood normal. Mood is not anxious or depressed. Affect is not labile, blunt, angry or inappropriate.        Speech: Speech normal.        Behavior: Behavior normal.        Thought Content: Thought content normal. Thought content is not paranoid or delusional. Thought content does not include homicidal or  suicidal ideation. Thought content does not include homicidal or suicidal plan.        Cognition and Memory: Cognition and memory normal.        Judgment: Judgment normal.     Comments: Insight intact     Lab Review:     Component Value Date/Time   NA 134 (L) 05/23/2018 1842   K 3.5 05/23/2018 1842   CL 99 05/23/2018 1842   CO2 25 05/23/2018 1842   GLUCOSE 92 05/23/2018 1842   BUN 8 05/23/2018 1842   CREATININE 1.36 (H) 05/23/2018 1842   CALCIUM 9.2 05/23/2018 1842   PROT 6.6 05/16/2016 0511   ALBUMIN 3.8 05/16/2016 0511   AST 39 05/16/2016 0511   ALT 55 05/16/2016 0511   ALKPHOS 52 05/16/2016 0511   BILITOT 0.6 05/16/2016 0511   GFRNONAA >60 05/23/2018 1842   GFRAA >60 05/23/2018 1842       Component Value Date/Time   WBC 5.4 05/23/2018 1842   RBC 5.29 05/23/2018 1842   HGB 15.4 05/23/2018 1842   HCT 45.8 05/23/2018 1842   PLT 182 05/23/2018 1842   MCV 86.6 05/23/2018 1842   MCH 29.1 05/23/2018 1842   MCHC 33.6 05/23/2018 1842   RDW 13.0 05/23/2018 1842   LYMPHSABS 1.7 05/23/2018 1842   MONOABS 0.7 05/23/2018 1842   EOSABS 0.1 05/23/2018 1842   BASOSABS 0.0 05/23/2018 1842    No results found for: "POCLITH", "LITHIUM"   Lab Results  Component Value Date   VALPROATE 57.9 12/29/2007     .res Assessment: Plan:    Plan:  PDMP reviewed  1. Lamictal '150mg'$  - 2 at bedtime. 2. Depakote ER '500mg'$  - 4 at bedtime 3. Xanax '1mg'$  BID  Lab slip given for Depakote level.  RTC 3 months  Patient advised to contact office with any questions, adverse effects, or acute worsening in signs and symptoms.  Discussed potential benefits, risk, and side effects of benzodiazepines to include potential risk of tolerance and dependence, as well as possible drowsiness.  Advised patient not to drive if experiencing drowsiness and to take lowest possible effective dose to minimize risk of dependence and tolerance.   Counseled patient regarding potential benefits, risks, and side  effects of Lamictal to include potential risk of Stevens-Johnson syndrome. Advised patient to stop taking Lamictal and contact office immediately if rash develops and to seek urgent medical attention if rash is severe and/or spreading quickly.  Diagnoses and all orders for this visit:  High risk medication use -     Valproic acid level  Chronic post-traumatic stress disorder -     ALPRAZolam (XANAX) 1 MG tablet; Take 1 tablet (1 mg total) by mouth 2 (two) times daily as needed for anxiety or sleep. -     divalproex (DEPAKOTE ER) 500 MG 24 hr tablet; Take 4 tablets (2,000 mg total) by  mouth at bedtime. -     lamoTRIgine (LAMICTAL) 150 MG tablet; Take 2 tablets (300 mg total) by mouth at bedtime.  Bipolar I disorder, most recent episode (or current) manic (HCC) -     divalproex (DEPAKOTE ER) 500 MG 24 hr tablet; Take 4 tablets (2,000 mg total) by mouth at bedtime. -     lamoTRIgine (LAMICTAL) 150 MG tablet; Take 2 tablets (300 mg total) by mouth at bedtime.  Binge eating disorder  Attention deficit hyperactivity disorder, combined type     Please see After Visit Summary for patient specific instructions.  No future appointments.  Orders Placed This Encounter  Procedures   Valproic acid level    -------------------------------

## 2021-10-09 DIAGNOSIS — S93622A Sprain of tarsometatarsal ligament of left foot, initial encounter: Secondary | ICD-10-CM | POA: Diagnosis not present

## 2021-11-06 DIAGNOSIS — S93622A Sprain of tarsometatarsal ligament of left foot, initial encounter: Secondary | ICD-10-CM | POA: Diagnosis not present

## 2021-11-18 DIAGNOSIS — M25521 Pain in right elbow: Secondary | ICD-10-CM | POA: Diagnosis not present

## 2021-11-18 DIAGNOSIS — M7712 Lateral epicondylitis, left elbow: Secondary | ICD-10-CM | POA: Diagnosis not present

## 2021-11-18 DIAGNOSIS — M7711 Lateral epicondylitis, right elbow: Secondary | ICD-10-CM | POA: Diagnosis not present

## 2021-11-18 DIAGNOSIS — M25522 Pain in left elbow: Secondary | ICD-10-CM | POA: Diagnosis not present

## 2021-12-16 DIAGNOSIS — J309 Allergic rhinitis, unspecified: Secondary | ICD-10-CM | POA: Diagnosis not present

## 2022-01-01 ENCOUNTER — Encounter: Payer: Self-pay | Admitting: Adult Health

## 2022-01-01 ENCOUNTER — Ambulatory Visit (INDEPENDENT_AMBULATORY_CARE_PROVIDER_SITE_OTHER): Payer: Medicare Other | Admitting: Adult Health

## 2022-01-01 DIAGNOSIS — Z79899 Other long term (current) drug therapy: Secondary | ICD-10-CM

## 2022-01-01 DIAGNOSIS — F5081 Binge eating disorder: Secondary | ICD-10-CM

## 2022-01-01 DIAGNOSIS — F3110 Bipolar disorder, current episode manic without psychotic features, unspecified: Secondary | ICD-10-CM | POA: Diagnosis not present

## 2022-01-01 DIAGNOSIS — F4312 Post-traumatic stress disorder, chronic: Secondary | ICD-10-CM

## 2022-01-01 DIAGNOSIS — F902 Attention-deficit hyperactivity disorder, combined type: Secondary | ICD-10-CM | POA: Diagnosis not present

## 2022-01-01 DIAGNOSIS — F50819 Binge eating disorder, unspecified: Secondary | ICD-10-CM

## 2022-01-01 MED ORDER — ALPRAZOLAM 1 MG PO TABS
1.0000 mg | ORAL_TABLET | Freq: Two times a day (BID) | ORAL | 2 refills | Status: DC | PRN
Start: 2022-01-01 — End: 2022-04-09

## 2022-01-01 NOTE — Progress Notes (Signed)
WINDSOR GOEKEN [id number] March 03, 1980 41 y.o.  Subjective:   Patient ID:  Paul Simmons is a 41 y.o. (DOB January 18, 1981) male.  Chief Complaint: No chief complaint on file.   HPI Paul Simmons presents to the office today for follow-up of ADHD, Binge eating, BPD-1 and PTSD.   Describes mood today as "alright". Pleasant. Mood symptoms - denies depression and anxiety. Feels irritable at times. Reports some situational stressors. Mood is consistent. Stating "I'm doing ok". Feels like medications continue to work well. He and wife doing well. Spending time with family over the holidays. Stable interest and motivation. Taking medications as prescribed.  Energy levels stable. Active, does not have a regular exercise routine.  Enjoys some usual interests and activities. Married. Lives with wife and 1 dog - Engineer, structural". Family in area. Spending time with family. Appetite adequate. Weight gain with limited mobility - 290 pounds. Sleeps better some nights than others. Averages 8 total hours.  Focus and concentration stable. Completing tasks. Managing aspects of household. Disabled since 2008. Denies SI or HI.  Denies AH or VH. Consumes ETOH.  Previous medication trials: Unknown  Review of Systems:  Review of Systems  Musculoskeletal:  Negative for gait problem.  Neurological:  Negative for tremors.  Psychiatric/Behavioral:         Please refer to HPI    Medications: I have reviewed the patient's current medications.  Current Outpatient Medications  Medication Sig Dispense Refill   ALPRAZolam (XANAX) 1 MG tablet Take 1 tablet (1 mg total) by mouth 2 (two) times daily as needed for anxiety or sleep. 60 tablet 2   aspirin EC 81 MG tablet Take 1 tablet (81 mg total) by mouth 2 (two) times daily. 28 tablet 0   divalproex (DEPAKOTE ER) 500 MG 24 hr tablet Take 4 tablets (2,000 mg total) by mouth at bedtime. 120 tablet 5   lamoTRIgine (LAMICTAL) 150 MG tablet Take 2 tablets (300 mg total) by  mouth at bedtime. 180 tablet 3   levothyroxine (SYNTHROID, LEVOTHROID) 50 MCG tablet Take 50 mcg by mouth daily before breakfast.     phentermine 37.5 MG capsule Take 1 capsule by mouth daily. 90 capsule 0   senna (SENOKOT) 8.6 MG TABS tablet Take 2 tablets (17.2 mg total) by mouth 2 (two) times daily. 30 tablet 0   No current facility-administered medications for this visit.    Medication Side Effects: None  Allergies: No Known Allergies  Past Medical History:  Diagnosis Date   Bipolar 1 disorder ([place])    Bipolar disorder ([place]) 05/14/2016   Bipolar I disorder, most recent episode (or current) manic ([place]) 01/22/2018   Chronic cholecystitis with calculus 05/14/2016   Hyperlipidemia    Hypothyroidism    OSA (obstructive sleep apnea) 05/14/2016   He has a CPAP machine but does not use it.   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   Please see review of systems for further details on the patient's review from today.   Objective:   Physical Exam:  There were no vitals taken for this visit.  Physical Exam Neurological:     Mental Status: He is alert and oriented to person, place, and time.     Cranial Nerves: No dysarthria.  Psychiatric:        Attention and Perception: Attention and perception normal.  Mood and Affect: Mood normal.        Speech: Speech normal.        Behavior: Behavior is cooperative.        Thought Content: Thought content normal. Thought content is not paranoid or delusional. Thought content does not include homicidal or suicidal ideation. Thought content does not include homicidal or suicidal plan.        Cognition and Memory: Cognition and memory normal.        Judgment: Judgment normal.     Comments: Insight intact     Lab Review:     Component Value Date/Time   NA 134 (L)  05/23/2018 1842   K 3.5 05/23/2018 1842   CL 99 05/23/2018 1842   CO2 25 05/23/2018 1842   GLUCOSE 92 05/23/2018 1842   BUN 8 05/23/2018 1842   CREATININE 1.36 (H) 05/23/2018 1842   CALCIUM 9.2 05/23/2018 1842   PROT 6.6 05/16/2016 0511   ALBUMIN 3.8 05/16/2016 0511   AST 39 05/16/2016 0511   ALT 55 05/16/2016 0511   ALKPHOS 52 05/16/2016 0511   BILITOT 0.6 05/16/2016 0511   GFRNONAA >60 05/23/2018 1842   GFRAA >60 05/23/2018 1842       Component Value Date/Time   WBC 5.4 05/23/2018 1842   RBC 5.29 05/23/2018 1842   HGB 15.4 05/23/2018 1842   HCT 45.8 05/23/2018 1842   PLT 182 05/23/2018 1842   MCV 86.6 05/23/2018 1842   MCH 29.1 05/23/2018 1842   MCHC 33.6 05/23/2018 1842   RDW 13.0 05/23/2018 1842   LYMPHSABS 1.7 05/23/2018 1842   MONOABS 0.7 05/23/2018 1842   EOSABS 0.1 05/23/2018 1842   BASOSABS 0.0 05/23/2018 1842    No results found for: "POCLITH", "LITHIUM"   Lab Results  Component Value Date   VALPROATE 57.9 12/29/2007     .res Assessment: Plan:    Plan:  PDMP reviewed  1. Lamictal '150mg'$  - 2 at bedtime. 2. Depakote ER '500mg'$  - 4 at bedtime 3. Xanax '1mg'$  BID  Lab slip given for Depakote level.  RTC 3 months  Patient advised to contact office with any questions, adverse effects, or acute worsening in signs and symptoms.  Discussed potential benefits, risk, and side effects of benzodiazepines to include potential risk of tolerance and dependence, as well as possible drowsiness.  Advised patient not to drive if experiencing drowsiness and to take lowest possible effective dose to minimize risk of dependence and tolerance.   Counseled patient regarding potential benefits, risks, and side effects of Lamictal to include potential risk of Stevens-Johnson syndrome. Advised patient to stop taking Lamictal and contact office immediately if rash develops and to seek urgent medical attention if rash is severe and/or spreading quickly.    Diagnoses and all  orders for this visit:  Bipolar I disorder, most recent episode (or current) manic (HCC)  High risk medication use -     Valproic acid level  Chronic post-traumatic stress disorder -     ALPRAZolam (XANAX) 1 MG tablet; Take 1 tablet (1 mg total) by mouth 2 (two) times daily as needed for anxiety or sleep.  Binge eating disorder  Attention deficit hyperactivity disorder, combined type     Please see After Visit Summary for patient specific instructions.  Future Appointments  Date Time Provider [place]  04/03/2022 11:20 AM Derika Eckles, Berdie Ogren, NP CP-CP None    Orders Placed This Encounter  Procedures   Valproic acid level    -------------------------------

## 2022-01-10 DIAGNOSIS — S93622A Sprain of tarsometatarsal ligament of left foot, initial encounter: Secondary | ICD-10-CM | POA: Diagnosis not present

## 2022-03-03 DIAGNOSIS — M7711 Lateral epicondylitis, right elbow: Secondary | ICD-10-CM | POA: Diagnosis not present

## 2022-03-03 DIAGNOSIS — M25521 Pain in right elbow: Secondary | ICD-10-CM | POA: Diagnosis not present

## 2022-03-18 DIAGNOSIS — Z79899 Other long term (current) drug therapy: Secondary | ICD-10-CM | POA: Diagnosis not present

## 2022-03-31 DIAGNOSIS — M25521 Pain in right elbow: Secondary | ICD-10-CM | POA: Diagnosis not present

## 2022-04-03 ENCOUNTER — Ambulatory Visit (INDEPENDENT_AMBULATORY_CARE_PROVIDER_SITE_OTHER): Payer: Self-pay | Admitting: Adult Health

## 2022-04-03 DIAGNOSIS — F489 Nonpsychotic mental disorder, unspecified: Secondary | ICD-10-CM

## 2022-04-03 NOTE — Progress Notes (Signed)
Patient no show appointment. ? ?

## 2022-04-09 ENCOUNTER — Encounter: Payer: Self-pay | Admitting: Adult Health

## 2022-04-09 ENCOUNTER — Ambulatory Visit (INDEPENDENT_AMBULATORY_CARE_PROVIDER_SITE_OTHER): Payer: Medicare Other | Admitting: Adult Health

## 2022-04-09 DIAGNOSIS — F5081 Binge eating disorder: Secondary | ICD-10-CM | POA: Diagnosis not present

## 2022-04-09 DIAGNOSIS — F902 Attention-deficit hyperactivity disorder, combined type: Secondary | ICD-10-CM | POA: Diagnosis not present

## 2022-04-09 DIAGNOSIS — F3110 Bipolar disorder, current episode manic without psychotic features, unspecified: Secondary | ICD-10-CM | POA: Diagnosis not present

## 2022-04-09 DIAGNOSIS — F4312 Post-traumatic stress disorder, chronic: Secondary | ICD-10-CM

## 2022-04-09 MED ORDER — ALPRAZOLAM 1 MG PO TABS
1.0000 mg | ORAL_TABLET | Freq: Two times a day (BID) | ORAL | 2 refills | Status: DC | PRN
Start: 2022-04-09 — End: 2022-07-08

## 2022-04-09 MED ORDER — DIVALPROEX SODIUM ER 500 MG PO TB24: 2000.0000 mg | ORAL_TABLET | Freq: Every day | ORAL | 3 refills | Status: DC

## 2022-04-09 NOTE — Progress Notes (Signed)
CORNELL LUTMAN AS:[id number] 01-05-1981 42 y.o.  Subjective:   Patient ID:  Paul Simmons is a 42 y.o. (DOB 1980-08-03) male.  Chief Complaint: No chief complaint on file.   HPI Paul Simmons presents to the office today for follow-up of ADHD, Binge eating, BPD-1 and PTSD.   Describes mood today as "ok". Pleasant. Mood symptoms - denies depression and anxiety. Feels irritable at times - more situational. Mood is consistent. Stating "I feel like I'm doing alright". Feels like medications continue to work well. He and wife doing well. Recovering from surgery. Stable interest and motivation. Taking medications as prescribed.  Energy levels stable. Active, does not have a regular exercise routine.  Enjoys some usual interests and activities. Married. Lives with wife and 1 dog - Engineer, structural". Family in area. Spending time with family. Appetite adequate. Weight gain with limited mobility - 290 to 300 pounds. Sleeps better some nights than others. Averages 8 total hours.  Focus and concentration stable. Completing tasks. Managing aspects of household. Disabled since 2008. Denies SI or HI.  Denies AH or VH. Denies self harm. Consumes ETOH occasionally.  Previous medication trials: Unknown  Review of Systems:  Review of Systems  Musculoskeletal:  Negative for gait problem.  Neurological:  Negative for tremors.  Psychiatric/Behavioral:         Please refer to HPI    Medications: I have reviewed the patient's current medications.  Current Outpatient Medications  Medication Sig Dispense Refill   ALPRAZolam (XANAX) 1 MG tablet Take 1 tablet (1 mg total) by mouth 2 (two) times daily as needed for anxiety or sleep. 60 tablet 2   aspirin EC 81 MG tablet Take 1 tablet (81 mg total) by mouth 2 (two) times daily. 28 tablet 0   divalproex (DEPAKOTE ER) 500 MG 24 hr tablet Take 4 tablets (2,000 mg total) by mouth at bedtime. 360 tablet 3   lamoTRIgine (LAMICTAL) 150 MG tablet Take 2 tablets (300 mg  total) by mouth at bedtime. 180 tablet 3   levothyroxine (SYNTHROID, LEVOTHROID) 50 MCG tablet Take 50 mcg by mouth daily before breakfast.     phentermine 37.5 MG capsule Take 1 capsule by mouth daily. 90 capsule 0   senna (SENOKOT) 8.6 MG TABS tablet Take 2 tablets (17.2 mg total) by mouth 2 (two) times daily. 30 tablet 0   No current facility-administered medications for this visit.    Medication Side Effects: None  Allergies: No Known Allergies  Past Medical History:  Diagnosis Date   Bipolar 1 disorder ([place])    Bipolar disorder ([place]) 05/14/2016   Bipolar I disorder, most recent episode (or current) manic ([place]) 01/22/2018   Chronic cholecystitis with calculus 05/14/2016   Hyperlipidemia    Hypothyroidism    OSA (obstructive sleep apnea) 05/14/2016   He has a CPAP machine but does not use it.   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   Please see review of systems for further details on the patient's review from today.   Objective:   Physical Exam:  There were no vitals taken for this visit.  Physical Exam Constitutional:      General: He is not in acute distress. Musculoskeletal:        General: No deformity.  Neurological:     Mental Status: He is alert and oriented to person, place,  and time.     Cranial Nerves: No dysarthria.     Coordination: Coordination normal.  Psychiatric:        Attention and Perception: Attention and perception normal. He does not perceive auditory or visual hallucinations.        Mood and Affect: Mood normal. Mood is not anxious or depressed. Affect is not labile, blunt, angry or inappropriate.        Speech: Speech normal.        Behavior: Behavior normal. Behavior is cooperative.        Thought Content: Thought content normal. Thought content is not paranoid or delusional.  Thought content does not include homicidal or suicidal ideation. Thought content does not include homicidal or suicidal plan.        Cognition and Memory: Cognition and memory normal.        Judgment: Judgment normal.     Comments: Insight intact     Lab Review:     Component Value Date/Time   NA 134 (L) 05/23/2018 1842   K 3.5 05/23/2018 1842   CL 99 05/23/2018 1842   CO2 25 05/23/2018 1842   GLUCOSE 92 05/23/2018 1842   BUN 8 05/23/2018 1842   CREATININE 1.36 (H) 05/23/2018 1842   CALCIUM 9.2 05/23/2018 1842   PROT 6.6 05/16/2016 0511   ALBUMIN 3.8 05/16/2016 0511   AST 39 05/16/2016 0511   ALT 55 05/16/2016 0511   ALKPHOS 52 05/16/2016 0511   BILITOT 0.6 05/16/2016 0511   GFRNONAA >60 05/23/2018 1842   GFRAA >60 05/23/2018 1842       Component Value Date/Time   WBC 5.4 05/23/2018 1842   RBC 5.29 05/23/2018 1842   HGB 15.4 05/23/2018 1842   HCT 45.8 05/23/2018 1842   PLT 182 05/23/2018 1842   MCV 86.6 05/23/2018 1842   MCH 29.1 05/23/2018 1842   MCHC 33.6 05/23/2018 1842   RDW 13.0 05/23/2018 1842   LYMPHSABS 1.7 05/23/2018 1842   MONOABS 0.7 05/23/2018 1842   EOSABS 0.1 05/23/2018 1842   BASOSABS 0.0 05/23/2018 1842    No results found for: "POCLITH", "LITHIUM"   Lab Results  Component Value Date   VALPROATE 57.9 12/29/2007     .res Assessment: Plan:    Plan:  PDMP reviewed  1. Lamictal '150mg'$  - 2 at bedtime. 2. Depakote ER '500mg'$  - 4 at bedtime 3. Xanax '1mg'$  BID  Lab slip given for Depakote level.  RTC 3 months  Patient advised to contact office with any questions, adverse effects, or acute worsening in signs and symptoms.  Discussed potential benefits, risk, and side effects of benzodiazepines to include potential risk of tolerance and dependence, as well as possible drowsiness.  Advised patient not to drive if experiencing drowsiness and to take lowest possible effective dose to minimize risk of dependence and tolerance.   Counseled patient  regarding potential benefits, risks, and side effects of Lamictal to include potential risk of Stevens-Johnson syndrome. Advised patient to stop taking Lamictal and contact office immediately if rash develops and to seek urgent medical attention if rash is severe and/or spreading quickly.  Diagnoses and all orders for this visit:  Bipolar I disorder, most recent episode (or current) manic ([facility]CC) -     divalproex (DEPAKOTE ER) 500 MG 24 hr tablet; Take 4 tablets (2,000 mg total) by mouth at bedtime.  Chronic post-traumatic stress disorder -     ALPRAZolam (XANAX) 1 MG tablet; Take 1 tablet (1 mg total) by mouth  2 (two) times daily as needed for anxiety or sleep. -     divalproex (DEPAKOTE ER) 500 MG 24 hr tablet; Take 4 tablets (2,000 mg total) by mouth at bedtime.  Attention deficit hyperactivity disorder, combined type  Binge eating disorder     Please see After Visit Summary for patient specific instructions.  No future appointments.   No orders of the defined types were placed in this encounter.   -------------------------------

## 2022-04-14 DIAGNOSIS — M7711 Lateral epicondylitis, right elbow: Secondary | ICD-10-CM | POA: Diagnosis not present

## 2022-04-14 DIAGNOSIS — Z4789 Encounter for other orthopedic aftercare: Secondary | ICD-10-CM | POA: Diagnosis not present

## 2022-04-14 DIAGNOSIS — M7712 Lateral epicondylitis, left elbow: Secondary | ICD-10-CM | POA: Diagnosis not present

## 2022-04-16 DIAGNOSIS — Z1331 Encounter for screening for depression: Secondary | ICD-10-CM | POA: Diagnosis not present

## 2022-04-16 DIAGNOSIS — E782 Mixed hyperlipidemia: Secondary | ICD-10-CM | POA: Diagnosis not present

## 2022-04-16 DIAGNOSIS — J309 Allergic rhinitis, unspecified: Secondary | ICD-10-CM | POA: Diagnosis not present

## 2022-04-16 DIAGNOSIS — F3131 Bipolar disorder, current episode depressed, mild: Secondary | ICD-10-CM | POA: Diagnosis not present

## 2022-04-16 DIAGNOSIS — I10 Essential (primary) hypertension: Secondary | ICD-10-CM | POA: Diagnosis not present

## 2022-04-16 DIAGNOSIS — Z0000 Encounter for general adult medical examination without abnormal findings: Secondary | ICD-10-CM | POA: Diagnosis not present

## 2022-04-16 DIAGNOSIS — J45909 Unspecified asthma, uncomplicated: Secondary | ICD-10-CM | POA: Diagnosis not present

## 2022-04-16 DIAGNOSIS — M25521 Pain in right elbow: Secondary | ICD-10-CM | POA: Diagnosis not present

## 2022-04-16 DIAGNOSIS — N182 Chronic kidney disease, stage 2 (mild): Secondary | ICD-10-CM | POA: Diagnosis not present

## 2022-04-16 DIAGNOSIS — E038 Other specified hypothyroidism: Secondary | ICD-10-CM | POA: Diagnosis not present

## 2022-07-08 ENCOUNTER — Ambulatory Visit (INDEPENDENT_AMBULATORY_CARE_PROVIDER_SITE_OTHER): Payer: Medicare Other | Admitting: Adult Health

## 2022-07-08 ENCOUNTER — Encounter: Payer: Self-pay | Admitting: Adult Health

## 2022-07-08 DIAGNOSIS — F4312 Post-traumatic stress disorder, chronic: Secondary | ICD-10-CM | POA: Diagnosis not present

## 2022-07-08 DIAGNOSIS — F902 Attention-deficit hyperactivity disorder, combined type: Secondary | ICD-10-CM

## 2022-07-08 DIAGNOSIS — F3110 Bipolar disorder, current episode manic without psychotic features, unspecified: Secondary | ICD-10-CM

## 2022-07-08 DIAGNOSIS — F5081 Binge eating disorder: Secondary | ICD-10-CM | POA: Diagnosis not present

## 2022-07-08 MED ORDER — LAMOTRIGINE 150 MG PO TABS
300.0000 mg | ORAL_TABLET | Freq: Every day | ORAL | 3 refills | Status: DC
Start: 2022-07-08 — End: 2023-08-18

## 2022-07-08 MED ORDER — ALPRAZOLAM 1 MG PO TABS
1.0000 mg | ORAL_TABLET | Freq: Two times a day (BID) | ORAL | 2 refills | Status: DC | PRN
Start: 2022-07-08 — End: 2022-10-08

## 2022-07-08 NOTE — Progress Notes (Signed)
Paul Simmons [id number] 1980-05-12 42 y.o.  Subjective:   Patient ID:  Paul Simmons is a 41 y.o. (DOB 1980/06/20) male.  Chief Complaint: No chief complaint on file.   HPI Paul Simmons presents to the office today for follow-up of ADHD, Binge eating, BPD-1 and PTSD.   Describes mood today as "ok". Pleasant. Mood symptoms - denies depression and anxiety. Reports irritability at times - mostly when around other people. Mood is consistent. Stating "I feel like I'm doing ok". Feels like medications continue to work well. He and wife doing well. Stable interest and motivation. Taking medications as prescribed.  Energy levels stable. Active, does not have a regular exercise routine.  Enjoys some usual interests and activities. Married. 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 Family in area. Spending time with family. Appetite adequate. Weight gain - 290 to 300 pounds. Sleeping well most nights. Averages 8 total hours.  Focus and concentration stable. Completing tasks. Managing aspects of household. Disabled since 2008. Denies SI or HI.  Denies AH or VH. Denies self harm.   Previous medication trials: Unknown  Review of Systems:  Review of Systems  Musculoskeletal:  Negative for gait problem.  Neurological:  Negative for tremors.  Psychiatric/Behavioral:         Please refer to HPI    Medications: I have reviewed the patient's current medications.  Current Outpatient Medications  Medication Sig Dispense Refill   ALPRAZolam (XANAX) 1 MG tablet Take 1 tablet (1 mg total) by mouth 2 (two) times daily as needed for anxiety or sleep. 60 tablet 2   aspirin EC 81 MG tablet Take 1 tablet (81 mg total) by mouth 2 (two) times daily. 28 tablet 0   divalproex (DEPAKOTE ER) 500 MG 24 hr tablet Take 4 tablets (2,000 mg total) by mouth at bedtime. 360 tablet 3   lamoTRIgine (LAMICTAL) 150 MG tablet Take 2 tablets (300 mg total) by mouth at bedtime. 180 tablet 3   levothyroxine (SYNTHROID,  LEVOTHROID) 50 MCG tablet Take 50 mcg by mouth daily before breakfast.     phentermine 37.5 MG capsule Take 1 capsule by mouth daily. 90 capsule 0   senna (SENOKOT) 8.6 MG TABS tablet Take 2 tablets (17.2 mg total) by mouth 2 (two) times daily. 30 tablet 0   No current facility-administered medications for this visit.    Medication Side Effects: None  Allergies: No Known Allergies  Past Medical History:  Diagnosis Date   Bipolar 1 disorder (HCC)    Bipolar disorder (HCC) 05/14/2016   Bipolar I disorder, most recent episode (or current) manic ([facility]CC) 01/22/2018   Chronic cholecystitis with calculus 05/14/2016   Hyperlipidemia    Hypothyroidism    OSA (obstructive sleep apnea) 05/14/2016   He has a CPAP machine but does not use it.   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   Please see review of systems for further details on the patient's review from today.   Objective:   Physical Exam:  There were no vitals taken for this visit.  Physical Exam Constitutional:      General: He is not in acute distress. Musculoskeletal:        General: No deformity.  Neurological:     Mental Status: He is alert and oriented to person, place, and time.     Coordination: Coordination  normal.  Psychiatric:        Attention and Perception: Attention and perception normal. He does not perceive auditory or visual hallucinations.        Mood and Affect: Mood normal. Mood is not anxious or depressed. Affect is not labile, blunt, angry or inappropriate.        Speech: Speech normal.        Behavior: Behavior normal.        Thought Content: Thought content normal. Thought content is not paranoid or delusional. Thought content does not include homicidal or suicidal ideation. Thought content does not include homicidal or suicidal plan.         Cognition and Memory: Cognition and memory normal.        Judgment: Judgment normal.     Comments: Insight intact     Lab Review:     Component Value Date/Time   NA 134 (L) 05/23/2018 1842   K 3.5 05/23/2018 1842   CL 99 05/23/2018 1842   CO2 25 05/23/2018 1842   GLUCOSE 92 05/23/2018 1842   BUN 8 05/23/2018 1842   CREATININE 1.36 (H) 05/23/2018 1842   CALCIUM 9.2 05/23/2018 1842   PROT 6.6 05/16/2016 0511   ALBUMIN 3.8 05/16/2016 0511   AST 39 05/16/2016 0511   ALT 55 05/16/2016 0511   ALKPHOS 52 05/16/2016 0511   BILITOT 0.6 05/16/2016 0511   GFRNONAA >60 05/23/2018 1842   GFRAA >60 05/23/2018 1842       Component Value Date/Time   WBC 5.4 05/23/2018 1842   RBC 5.29 05/23/2018 1842   HGB 15.4 05/23/2018 1842   HCT 45.8 05/23/2018 1842   PLT 182 05/23/2018 1842   MCV 86.6 05/23/2018 1842   MCH 29.1 05/23/2018 1842   MCHC 33.6 05/23/2018 1842   RDW 13.0 05/23/2018 1842   LYMPHSABS 1.7 05/23/2018 1842   MONOABS 0.7 05/23/2018 1842   EOSABS 0.1 05/23/2018 1842   BASOSABS 0.0 05/23/2018 1842    No results found for: "POCLITH", "LITHIUM"   Lab Results  Component Value Date   VALPROATE 57.9 12/29/2007     .res Assessment: Plan:    Plan:  PDMP reviewed  1. Lamictal 150mg  - 2 at bedtime. 2. Depakote ER 500mg  - 4 at bedtime 3. Xanax 1mg  BID  Lab slip given for Depakote level.  RTC 3 months  Patient advised to contact office with any questions, adverse effects, or acute worsening in signs and symptoms.  Discussed potential benefits, risk, and side effects of benzodiazepines to include potential risk of tolerance and dependence, as well as possible drowsiness.  Advised patient not to drive if experiencing drowsiness and to take lowest possible effective dose to minimize risk of dependence and tolerance.   Counseled patient regarding potential benefits, risks, and side effects of Lamictal to include potential risk of Stevens-Johnson syndrome. Advised  patient to stop taking Lamictal and contact office immediately if rash develops and to seek urgent medical attention if rash is severe and/or spreading quickly.  There are no diagnoses linked to this encounter.   Please see After Visit Summary for patient specific instructions.  No future appointments.  No orders of the defined types were placed in this encounter.   -------------------------------

## 2022-07-28 DIAGNOSIS — N182 Chronic kidney disease, stage 2 (mild): Secondary | ICD-10-CM | POA: Diagnosis not present

## 2022-07-28 DIAGNOSIS — I10 Essential (primary) hypertension: Secondary | ICD-10-CM | POA: Diagnosis not present

## 2022-07-28 DIAGNOSIS — F3131 Bipolar disorder, current episode depressed, mild: Secondary | ICD-10-CM | POA: Diagnosis not present

## 2022-07-28 DIAGNOSIS — M25521 Pain in right elbow: Secondary | ICD-10-CM | POA: Diagnosis not present

## 2022-07-28 DIAGNOSIS — J45909 Unspecified asthma, uncomplicated: Secondary | ICD-10-CM | POA: Diagnosis not present

## 2022-07-28 DIAGNOSIS — E038 Other specified hypothyroidism: Secondary | ICD-10-CM | POA: Diagnosis not present

## 2022-07-28 DIAGNOSIS — A932 Colorado tick fever: Secondary | ICD-10-CM | POA: Diagnosis not present

## 2022-07-28 DIAGNOSIS — E782 Mixed hyperlipidemia: Secondary | ICD-10-CM | POA: Diagnosis not present

## 2022-09-02 DIAGNOSIS — R002 Palpitations: Secondary | ICD-10-CM | POA: Diagnosis not present

## 2022-09-02 DIAGNOSIS — R0602 Shortness of breath: Secondary | ICD-10-CM | POA: Diagnosis not present

## 2022-09-02 DIAGNOSIS — I10 Essential (primary) hypertension: Secondary | ICD-10-CM | POA: Diagnosis not present

## 2022-09-08 DIAGNOSIS — R7989 Other specified abnormal findings of blood chemistry: Secondary | ICD-10-CM | POA: Diagnosis not present

## 2022-09-08 DIAGNOSIS — I517 Cardiomegaly: Secondary | ICD-10-CM | POA: Diagnosis not present

## 2022-09-08 DIAGNOSIS — I4891 Unspecified atrial fibrillation: Secondary | ICD-10-CM | POA: Diagnosis not present

## 2022-09-08 DIAGNOSIS — Z79899 Other long term (current) drug therapy: Secondary | ICD-10-CM | POA: Diagnosis not present

## 2022-09-12 DIAGNOSIS — I209 Angina pectoris, unspecified: Secondary | ICD-10-CM | POA: Diagnosis not present

## 2022-09-29 ENCOUNTER — Ambulatory Visit: Payer: Medicare Other | Attending: Internal Medicine | Admitting: Internal Medicine

## 2022-09-29 ENCOUNTER — Ambulatory Visit (INDEPENDENT_AMBULATORY_CARE_PROVIDER_SITE_OTHER): Payer: Medicare Other

## 2022-09-29 ENCOUNTER — Ambulatory Visit: Payer: Medicare Other | Admitting: Internal Medicine

## 2022-09-29 VITALS — BP 144/68 | HR 70 | Ht 72.0 in | Wt 291.4 lb

## 2022-09-29 DIAGNOSIS — I471 Supraventricular tachycardia, unspecified: Secondary | ICD-10-CM | POA: Diagnosis not present

## 2022-09-29 DIAGNOSIS — E7800 Pure hypercholesterolemia, unspecified: Secondary | ICD-10-CM | POA: Diagnosis not present

## 2022-09-29 DIAGNOSIS — Z13220 Encounter for screening for lipoid disorders: Secondary | ICD-10-CM | POA: Diagnosis not present

## 2022-09-29 NOTE — Progress Notes (Signed)
Cardiology Office Note   Date:  09/29/2022   ID:  Paul Simmons, DOB [birth year], MRN [id number]  PCP:  Toma Deiters, MD  Cardiologist:   Dietrich Pates, MD   Pt referred for evaluation of tachycardia     History of Present Illness: Paul Simmons is a 42 y.o. male with a history of OSA(not using CPAP), PTSD, HL,  THe pt as had episodes of tachycardia   Not with activity   Can wake him from sleep   Occurring initially one time per month, then weekly now even 2x in a day  Episodes are self limited.   HR up to 170s to 190s (this lasted 7 min)   With spells feels fatigued, tired   No severe dizziness When not having spells feels OK    No SOB   NO CP   Seen in ER  in July   CTA of chest neg for PE  Echo  LVEF normal  RVEF normal   Atria normal in size     FHx of CAD        Current Meds  Medication Sig   ALPRAZolam (XANAX) 1 MG tablet Take 1 tablet (1 mg total) by mouth 2 (two) times daily as needed for anxiety or sleep.   divalproex (DEPAKOTE ER) 500 MG 24 hr tablet Take 4 tablets (2,000 mg total) by mouth at bedtime.   lamoTRIgine (LAMICTAL) 150 MG tablet Take 2 tablets (300 mg total) by mouth at bedtime.   levothyroxine (SYNTHROID, LEVOTHROID) 50 MCG tablet Take 50 mcg by mouth daily before breakfast.     Allergies:   Patient has no known allergies.   Past Medical History:  Diagnosis Date   Bipolar 1 disorder (HCC)    Bipolar disorder (HCC) 05/14/2016   Bipolar I disorder, most recent episode (or current) manic ([facility]CC) 01/22/2018   Chronic cholecystitis with calculus 05/14/2016   Hyperlipidemia    Hypothyroidism    OSA (obstructive sleep apnea) 05/14/2016   He has a CPAP machine but does not use it.   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[employer]    Past Surgical History:  Procedure Laterality Date   CHOLECYSTECTOMY N/A 05/15/2016   Procedure: LAPAROSCOPIC CHOLECYSTECTOMY WITH INTRAOPERATIVE CHOLANGIOGRAM;  Surgeon:  Luretha Murphy, MD;  Location: [facility];  Service: General;  Laterality: N/A;   left varicose vein surgery     REPAIR OF PERONEUS BREVIS TENDON Left 05/12/2019   Procedure: Left peroneal tendon debridement and repair, calcaneous lateral wall exostectomy;  Surgeon: Toni Arthurs, MD;  Location: [facility];  Service: Orthopedics;  Laterality: Left;   Right Ankle Surgery     x 6 surgeries   TONSILLECTOMY       Social History:  The patient  reports that he has never smoked. He has quit using smokeless tobacco.  His smokeless tobacco use included chew. He reports current alcohol use. He reports that he does not use drugs.   Family History:  The patient's family history is not on file.    ROS:  Please see the history of present illness. All other systems are reviewed and  Negative to the above problem except as noted.    PHYSICAL EXAM: VS:  BP (!) 144/68   Pulse 70   Ht 6' (1.829 m)   Wt 291 lb 6.4 oz (132.2 kg)   SpO2 95%   BMI 39.52 kg/m   GEN: Well nourished, well developed, in  no acute distress  HEENT: normal  Neck: no JVD, carotid bruits, or masses Cardiac: RRR; no murmurs, rubs, or gallops,no edema  Respiratory:  clear to auscultation bilaterally, normal work of breathing GI: soft, nontender, nondistended, + BS  No hepatomegaly  MS: no deformity Moving all extremities   Skin: warm and dry, no rash Neuro:  Strength and sensation are intact Psych: euthymic mood, full affect   EKG:  EKG is not ordered today.  OUtside EKG   SR   (09/02/22)   Lipid Panel No results found for: "CHOL", "TRIG", "HDL", "CHOLHDL", "VLDL", "LDLCALC", "LDLDIRECT"    Wt Readings from Last 3 Encounters:  09/29/22 291 lb 6.4 oz (132.2 kg)  05/12/19 293 lb 10.4 oz (133.2 kg)  05/23/18 270 lb (122.5 kg)      ASSESSMENT AND PLAN:  1  Tachycardia   PT with spells of tachycardia  Sounds like SVT or atrial fibrillation   Need to document   Will set up for Zio patch  2  Lipids   LDL 124   HDL 39     3  Cardiac risk assessment   FHx of CAD   Pt without CP   WIll review outside CT   No mention of atherosclerosis.  Will review  Consider other testing to stratify     Current medicines are reviewed at length with the patient today.  The patient does not have concerns regarding medicines.  Signed, Dietrich Pates, MD  09/29/2022 2:03 PM    [facility] [address] [place], [place], [place]  [address] Phone: [phone]; Fax: [phone]

## 2022-09-29 NOTE — Patient Instructions (Addendum)
Medication Instructions:   *If you need a refill on your cardiac medications before your next appointment, please call your pharmacy*   Lab Work: nmr, apo b, lipo a  If you have labs (blood work) drawn today and your tests are completely normal, you will receive your results only by: MyChart Message (if you have MyChart) OR A paper copy in the mail If you have any lab test that is abnormal or we need to change your treatment, we will call you to review the results.   Testing/Procedures:  Your physician has recommended that you wear an event monitor. Event monitors are medical devices that record the heart's electrical activity. Doctors most often [place] these monitors to diagnose arrhythmias. Arrhythmias are problems with the speed or rhythm of the heartbeat. The monitor is a small, portable device. You can wear one while you do your normal daily activities. This is usually used to diagnose what is causing palpitations/syncope (passing out).     Other Instructions

## 2022-09-30 DIAGNOSIS — I471 Supraventricular tachycardia, unspecified: Secondary | ICD-10-CM | POA: Diagnosis not present

## 2022-09-30 LAB — NMR, LIPOPROFILE
Cholesterol, Total: 191 mg/dL (ref 100–199)
HDL Particle Number: 24.7 umol/L — ABNORMAL LOW (ref >=30.5)
HDL-C: 37 mg/dL — ABNORMAL LOW (ref >39)
LDL Particle Number: 1290 nmol/L — ABNORMAL HIGH (ref <1000)
LDL Size: 21 nm (ref >20.5)
LDL-C (NIH Calc): 130 mg/dL — ABNORMAL HIGH (ref 0–99)
LP-IR Score: 41 (ref <=45)
Small LDL Particle Number: 369 nmol/L (ref <=527)
Triglycerides: 130 mg/dL (ref 0–149)

## 2022-09-30 LAB — APOLIPOPROTEIN B: Apolipoprotein B: 103 mg/dL — ABNORMAL HIGH (ref <90)

## 2022-09-30 LAB — LIPOPROTEIN A (LPA): Lipoprotein (a): 28.8 nmol/L (ref <75.0)

## 2022-10-02 DIAGNOSIS — I10 Essential (primary) hypertension: Secondary | ICD-10-CM | POA: Diagnosis not present

## 2022-10-02 DIAGNOSIS — E782 Mixed hyperlipidemia: Secondary | ICD-10-CM | POA: Diagnosis not present

## 2022-10-02 DIAGNOSIS — R002 Palpitations: Secondary | ICD-10-CM | POA: Diagnosis not present

## 2022-10-02 DIAGNOSIS — N182 Chronic kidney disease, stage 2 (mild): Secondary | ICD-10-CM | POA: Diagnosis not present

## 2022-10-02 DIAGNOSIS — G4730 Sleep apnea, unspecified: Secondary | ICD-10-CM | POA: Diagnosis not present

## 2022-10-02 DIAGNOSIS — E038 Other specified hypothyroidism: Secondary | ICD-10-CM | POA: Diagnosis not present

## 2022-10-02 DIAGNOSIS — F3131 Bipolar disorder, current episode depressed, mild: Secondary | ICD-10-CM | POA: Diagnosis not present

## 2022-10-06 ENCOUNTER — Telehealth: Payer: Self-pay | Admitting: Internal Medicine

## 2022-10-06 ENCOUNTER — Telehealth: Payer: Self-pay

## 2022-10-06 NOTE — Telephone Encounter (Signed)
Paul Simmons - phillips to report abnormal EKG

## 2022-10-06 NOTE — Telephone Encounter (Signed)
Need to get CT images (disc) from [place]    Given to Dr Ladona Ridgel.

## 2022-10-06 NOTE — Telephone Encounter (Signed)
   Cardiac Monitor Alert  Date of alert:  10/06/2022   Patient Name: Paul Simmons  DOB: December 12, 1980  MRN: [id number]   [facility] Cardiologist: None  [facility] EP:  None    Monitor Information: Cardiac Event Monitor [[employer]]  Reason:  Sinus Tachycardia Ordering provider:  Lovina Reach, MD   Alert Sinus Tachycardia - Heart rate 197 This is the 1st alert for this rhythm.   Next Cardiology Appointment   Date:  01/19/23  Provider:  Dr. Tenny Craw  The patient was contacted today.  He is asymptomatic. Arrhythmia, symptoms and history reviewed with DOD Lynnette Caffey.   Plan:  Keep follow up appointment with Tenny Craw   Other: Spoke with patient, asymptomatic at the time of report (10/02/22 at 1711) but patient states he could feel his heart racing, only lasted about 2 minutes per pt. Patient to wear heart monitor for 30 days, only 1 week in. Instructed to keep follow up appointment with Dr. Tenny Craw. Will have report scanned into chart  Festus Holts, RN  10/06/2022 9:23 AM

## 2022-10-06 NOTE — Telephone Encounter (Signed)
Paul Simmons with phillips  10/02/2002 @17 :12 patient initiated event with symptoms listed as heart racing with sinus tachy cardia 197 bpm  Nurse Shanda Bumps has already handled please see previous encounter for today.

## 2022-10-08 ENCOUNTER — Ambulatory Visit (INDEPENDENT_AMBULATORY_CARE_PROVIDER_SITE_OTHER): Payer: Medicare Other | Admitting: Adult Health

## 2022-10-08 ENCOUNTER — Encounter: Payer: Self-pay | Admitting: Adult Health

## 2022-10-08 DIAGNOSIS — F902 Attention-deficit hyperactivity disorder, combined type: Secondary | ICD-10-CM | POA: Diagnosis not present

## 2022-10-08 DIAGNOSIS — F5081 Binge eating disorder: Secondary | ICD-10-CM | POA: Diagnosis not present

## 2022-10-08 DIAGNOSIS — F3110 Bipolar disorder, current episode manic without psychotic features, unspecified: Secondary | ICD-10-CM

## 2022-10-08 DIAGNOSIS — F4312 Post-traumatic stress disorder, chronic: Secondary | ICD-10-CM | POA: Diagnosis not present

## 2022-10-08 MED ORDER — ALPRAZOLAM 1 MG PO TABS
1.0000 mg | ORAL_TABLET | Freq: Two times a day (BID) | ORAL | 2 refills | Status: DC | PRN
Start: 2022-10-08 — End: 2023-01-05

## 2022-10-08 NOTE — Progress Notes (Signed)
EFE BOURLAND [id number] 02-12-1980 42 y.o.  Subjective:   Patient ID:  Paul Simmons is a 42 y.o. (DOB [birth year]) male.  Chief Complaint: No chief complaint on file.   HPI Paul Simmons presents to the office today for follow-up of ADHD, Binge eating, BPD-1 and PTSD.   Describes mood today as "ok". Pleasant. Mood symptoms - denies depression, anxiety and irritability at times. Denies panic attacks. Reports some worry, rumination and over thinking - situational - heart issues - wearing a heart monitor. Mood is consistent. Stating "I'm worried right now - heart issues". Feels like medications continue to work well. He and wife doing well. Stable interest and motivation. Taking medications as prescribed.  Energy levels stable. Active, does not have a regular exercise routine.  Enjoys some usual interests and activities. Married. 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 Family in area. Spending time with family. Appetite adequate. Weight stable - 291 pounds. Sleeping well most nights. Averages 8 total hours.  Focus and concentration stable. Completing tasks. Managing aspects of household. Disabled since 2008. Denies SI or HI.  Denies AH or VH. Denies self harm. Denies substance use.  Previous medication trials: Unknown   Review of Systems:  Review of Systems  Musculoskeletal:  Negative for gait problem.  Neurological:  Negative for tremors.  Psychiatric/Behavioral:         Please refer to HPI    Medications: I have reviewed the patient's current medications.  Current Outpatient Medications  Medication Sig Dispense Refill   ALPRAZolam (XANAX) 1 MG tablet Take 1 tablet (1 mg total) by mouth 2 (two) times daily as needed for anxiety or sleep. 60 tablet 2   divalproex (DEPAKOTE ER) 500 MG 24 hr tablet Take 4 tablets (2,000 mg total) by mouth at bedtime. 360 tablet 3   lamoTRIgine (LAMICTAL) 150 MG tablet Take 2 tablets (300 mg total) by mouth at bedtime. 180 tablet 3    levothyroxine (SYNTHROID, LEVOTHROID) 50 MCG tablet Take 50 mcg by mouth daily before breakfast.     No current facility-administered medications for this visit.    Medication Side Effects: None  Allergies: No Known Allergies  Past Medical History:  Diagnosis Date   Bipolar 1 disorder (HCC)    Bipolar disorder (HCC) 05/14/2016   Bipolar I disorder, most recent episode (or current) manic (HCC) 01/22/2018   Chronic cholecystitis with calculus 05/14/2016   Hyperlipidemia    Hypothyroidism    OSA (obstructive sleep apnea) 05/14/2016   He has a CPAP machine but does not use it.   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   Please see review of systems for further details on the patient's review from today.   Objective:   Physical Exam:  There were no vitals taken for this visit.  Physical Exam Constitutional:      General: He is not in acute distress. Musculoskeletal:        General: No deformity.  Neurological:     Mental Status: He is alert and oriented to person, place, and time.     Coordination: Coordination normal.  Psychiatric:        Attention and Perception: Attention and perception normal. He does not perceive auditory or visual hallucinations.        Mood and Affect: Mood normal. Mood is not anxious or depressed. Affect is  not labile, blunt, angry or inappropriate.        Speech: Speech normal.        Behavior: Behavior normal.        Thought Content: Thought content normal. Thought content is not paranoid or delusional. Thought content does not include homicidal or suicidal ideation. Thought content does not include homicidal or suicidal plan.        Cognition and Memory: Cognition and memory normal.        Judgment: Judgment normal.     Comments: Insight intact     Lab Review:     Component Value  Date/Time   NA 134 (L) 05/23/2018 1842   K 3.5 05/23/2018 1842   CL 99 05/23/2018 1842   CO2 25 05/23/2018 1842   GLUCOSE 92 05/23/2018 1842   BUN 8 05/23/2018 1842   CREATININE 1.36 (H) 05/23/2018 1842   CALCIUM 9.2 05/23/2018 1842   PROT 6.6 05/16/2016 0511   ALBUMIN 3.8 05/16/2016 0511   AST 39 05/16/2016 0511   ALT 55 05/16/2016 0511   ALKPHOS 52 05/16/2016 0511   BILITOT 0.6 05/16/2016 0511   GFRNONAA >60 05/23/2018 1842   GFRAA >60 05/23/2018 1842       Component Value Date/Time   WBC 5.4 05/23/2018 1842   RBC 5.29 05/23/2018 1842   HGB 15.4 05/23/2018 1842   HCT 45.8 05/23/2018 1842   PLT 182 05/23/2018 1842   MCV 86.6 05/23/2018 1842   MCH 29.1 05/23/2018 1842   MCHC 33.6 05/23/2018 1842   RDW 13.0 05/23/2018 1842   LYMPHSABS 1.7 05/23/2018 1842   MONOABS 0.7 05/23/2018 1842   EOSABS 0.1 05/23/2018 1842   BASOSABS 0.0 05/23/2018 1842    No results found for: "POCLITH", "LITHIUM"   Lab Results  Component Value Date   VALPROATE 57.9 12/29/2007     .res Assessment: Plan:    Plan:  PDMP reviewed  1. Lamictal 150mg  - 2 at bedtime. 2. Depakote ER 500mg  - 4 at bedtime 3. Xanax 1mg  BID  Lab slip given for Depakote level.  RTC 3 months  Patient advised to contact office with any questions, adverse effects, or acute worsening in signs and symptoms.  Discussed potential benefits, risk, and side effects of benzodiazepines to include potential risk of tolerance and dependence, as well as possible drowsiness.  Advised patient not to drive if experiencing drowsiness and to take lowest possible effective dose to minimize risk of dependence and tolerance.   Counseled patient regarding potential benefits, risks, and side effects of Lamictal to include potential risk of Stevens-Johnson syndrome. Advised patient to stop taking Lamictal and contact office immediately if rash develops and to seek urgent medical attention if rash is severe and/or spreading quickly.    There are no diagnoses linked to this encounter.   Please see After Visit Summary for patient specific instructions.  Future Appointments  Date Time Provider Department Center  10/08/2022  8:40 AM Shirleymae Hauth, Thereasa Solo, NP [facility] None  01/19/2023  4:20 PM Pricilla Riffle, MD [facility]    No orders of the defined types were placed in this encounter.   -------------------------------

## 2022-10-28 DIAGNOSIS — W19XXXA Unspecified fall, initial encounter: Secondary | ICD-10-CM | POA: Diagnosis not present

## 2022-10-28 DIAGNOSIS — R42 Dizziness and giddiness: Secondary | ICD-10-CM | POA: Diagnosis not present

## 2022-10-28 DIAGNOSIS — I10 Essential (primary) hypertension: Secondary | ICD-10-CM | POA: Diagnosis not present

## 2022-10-28 DIAGNOSIS — R202 Paresthesia of skin: Secondary | ICD-10-CM | POA: Diagnosis not present

## 2022-11-20 ENCOUNTER — Other Ambulatory Visit: Payer: Self-pay

## 2022-11-20 MED ORDER — ROSUVASTATIN CALCIUM 10 MG PO TABS: 10.0000 mg | ORAL_TABLET | Freq: Every day | ORAL | 3 refills | Status: DC

## 2022-11-24 ENCOUNTER — Telehealth: Payer: Self-pay

## 2022-11-24 DIAGNOSIS — I471 Supraventricular tachycardia, unspecified: Secondary | ICD-10-CM

## 2022-11-24 NOTE — Telephone Encounter (Signed)
Attempted to call the pt but unable to leave a message due to his voicemail being full.   Monitor nurse checking on the pts full report of his event monitor... Lm for the pt to let him know we are still working on the full report but for now from what Dr Tenny Craw has seen... she recommends EP referral to Dr Ladona Ridgel for SVT.

## 2022-11-24 NOTE — Telephone Encounter (Signed)
-----   Message from Dietrich Pates sent at 11/20/2022 10:26 PM EDT ----- Regarding: RE: Monitor All I can see are a few strips   He did have SVT on monitor Not sure where full report is Please try to find With thse findings I would recomm referral to EP Rosette Reveal) ----- Message ----- From: Bertram Millard, RN Sent: 11/20/2022   5:28 PM EDT To: Pricilla Riffle, MD Subject: Monitor                                        Hey, he has a monitor in his chart.. does not look like it was sent to you.. odd it may not be ready yet the way it looks.. just wanted to see if you can take a look.. the pt was asking. He is seeing you back on 01/19/23.   Paul Simmons

## 2022-11-25 NOTE — Telephone Encounter (Signed)
Per staff message:   [facility] behind on processing EOS reports.  I have sent a message requesting it be expedited.  Thanks,  Shelly   I spoke with the pt and he agrees to EP referral..order placed. Will follow up with him when the full report comes back.

## 2022-11-26 DIAGNOSIS — G4730 Sleep apnea, unspecified: Secondary | ICD-10-CM | POA: Diagnosis not present

## 2022-11-26 DIAGNOSIS — F3131 Bipolar disorder, current episode depressed, mild: Secondary | ICD-10-CM | POA: Diagnosis not present

## 2022-11-26 DIAGNOSIS — I10 Essential (primary) hypertension: Secondary | ICD-10-CM | POA: Diagnosis not present

## 2022-11-26 DIAGNOSIS — R002 Palpitations: Secondary | ICD-10-CM | POA: Diagnosis not present

## 2022-11-26 DIAGNOSIS — N182 Chronic kidney disease, stage 2 (mild): Secondary | ICD-10-CM | POA: Diagnosis not present

## 2022-11-26 DIAGNOSIS — E038 Other specified hypothyroidism: Secondary | ICD-10-CM | POA: Diagnosis not present

## 2022-11-26 DIAGNOSIS — E782 Mixed hyperlipidemia: Secondary | ICD-10-CM | POA: Diagnosis not present

## 2022-12-01 ENCOUNTER — Encounter: Payer: Self-pay | Admitting: Internal Medicine

## 2022-12-01 ENCOUNTER — Ambulatory Visit: Payer: Medicare Other | Attending: Internal Medicine | Admitting: Internal Medicine

## 2022-12-01 VITALS — BP 116/70 | HR 69 | Ht 72.0 in | Wt 292.0 lb

## 2022-12-01 DIAGNOSIS — I471 Supraventricular tachycardia, unspecified: Secondary | ICD-10-CM

## 2022-12-01 MED ORDER — METOPROLOL SUCCINATE ER 25 MG PO TB24: 25.0000 mg | ORAL_TABLET | Freq: Every day | ORAL | 3 refills | Status: DC

## 2022-12-01 NOTE — Progress Notes (Signed)
HPI [name] is referred by Dr. Tenny Craw for evaluation of SVT. He is a pleasant 42 yo man with a h/o dyslipidemia and thyroid dysfunction. He has had palpitations for several months which start and stop suddenly. No syncope. He notes that he will sweat when the heart races. He denies chest pain or sob. He notes that the spells always stop within 10 minutes. No Known Allergies   Current Outpatient Medications  Medication Sig Dispense Refill   ALPRAZolam (XANAX) 1 MG tablet Take 1 tablet (1 mg total) by mouth 2 (two) times daily as needed for anxiety or sleep. 60 tablet 2   divalproex (DEPAKOTE ER) 500 MG 24 hr tablet Take 4 tablets (2,000 mg total) by mouth at bedtime. 360 tablet 3   lamoTRIgine (LAMICTAL) 150 MG tablet Take 2 tablets (300 mg total) by mouth at bedtime. 180 tablet 3   levothyroxine (SYNTHROID, LEVOTHROID) 50 MCG tablet Take 50 mcg by mouth daily before breakfast.     metoprolol succinate (TOPROL-XL) 25 MG 24 hr tablet Take 1 tablet (25 mg total) by mouth daily. 90 tablet 3   rosuvastatin (CRESTOR) 10 MG tablet Take 1 tablet (10 mg total) by mouth daily. 90 tablet 3   No current facility-administered medications for this visit.     Past Medical History:  Diagnosis Date   Bipolar 1 disorder (HCC)    Bipolar disorder (HCC) 05/14/2016   Bipolar I disorder, most recent episode (or current) manic (HCC) 01/22/2018   Chronic cholecystitis with calculus 05/14/2016   Hyperlipidemia    Hypothyroidism    OSA (obstructive sleep apnea) 05/14/2016   He has a CPAP machine but does not use it.   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   Past Surgical History:  Procedure Laterality Date   CHOLECYSTECTOMY N/A 05/15/2016   Procedure: LAPAROSCOPIC CHOLECYSTECTOMY WITH INTRAOPERATIVE CHOLANGIOGRAM;  Surgeon: Luretha Murphy, MD;  Location: [facility];  Service:  General;  Laterality: N/A;   left varicose vein surgery     REPAIR OF PERONEUS BREVIS TENDON Left 05/12/2019   Procedure: Left peroneal tendon debridement and repair, calcaneous lateral wall exostectomy;  Surgeon: Toni Arthurs, MD;  Location: [facility];  Service: Orthopedics;  Laterality: Left;   Right Ankle Surgery     x 6 surgeries   TONSILLECTOMY       History reviewed. No pertinent family history.   Social History   Socioeconomic History   Marital status: Married    Spouse name: Not on file   Number of children: Not on file   Years of education: Not on file   Highest education level: Not on file  Occupational History   Not on file  Tobacco Use   Smoking status: Never   Smokeless tobacco: Former    Types: Designer, multimedia Use   Vaping status: Never Used  Substance and Sexual Activity   Alcohol use: Yes    Comment: occassionally   Drug use: No   Sexual activity: Yes  Other Topics Concern   Not on file  Social History Narrative   Not on file   Social Determinants of Health   Financial Resource Strain: Not on file  Food Insecurity: Not on file  Transportation Needs: Not on file  Physical Activity: Not on file  Stress: Not on file  Social Connections: Unknown (  06/25/2021)   [facility]   Social Network    Social Network: Not on file  Intimate Partner Violence: Unknown (05/17/2021)   Received from [facility]   HITS    Physically Hurt: Not on file    Insult or Talk Down To: Not on file    Threaten Physical Harm: Not on file    Scream or Curse: Not on file     BP 116/70   Pulse 69   Ht 6' (1.829 m)   Wt 292 lb (132.5 kg)   SpO2 95%   BMI 39.60 kg/m   Physical Exam:  Well appearing NAD HEENT: Unremarkable Neck:  No JVD, no thyromegally Lymphatics:  No adenopathy Back:  No CVA tenderness Lungs:  Clear with no wheezes HEART:  Regular rate rhythm, no murmurs, no rubs, no clicks Abd:  soft,  positive bowel sounds, no organomegally, no rebound, no guarding Ext:  2 plus pulses, no edema, no cyanosis, no clubbing Skin:  No rashes no nodules Neuro:  CN II through XII intact, motor grossly intact  EKG - nsr with no pre-excitation.   Assess/Plan: SVT - I have discussed the risks/benefits/goals/expectations of EP study and catheter ablation. He would like to try medical therapy. We will start low dose toprol, with plans to titrate up as needed when he sees Dr. Tenny Craw in December. Sleep apnea - this is contributing however he has been non-compliant with his CPAP. I have encouraged him. Obesity - he is encouraged to lose weight.  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 Continue.   Sharlot Gowda Cory Rama,MD dylipidemia

## 2022-12-01 NOTE — Patient Instructions (Addendum)
Medication Instructions:  Your physician has recommended you make the following change in your medication:  Start toprol XL 25 mg daily  Lab Work: None ordered.  If you have labs (blood work) drawn today and your tests are completely normal, you will receive your results only by: MyChart Message (if you have MyChart) OR A paper copy in the mail If you have any lab test that is abnormal or we need to change your treatment, we will call you to review the results.  Testing/Procedures: None ordered.  Follow-Up: At [facility], you and your health needs are our priority.  As part of our continuing mission to provide you with exceptional heart care, we have created designated Provider Care Teams.  These Care Teams include your primary Cardiologist (physician) and Advanced Practice Providers (APPs -  Physician Assistants and Nurse Practitioners) who all work together to provide you with the care you need, when you need it.   Your next appointment:   March 2025 in [place]  The format for your next appointment:   In Person  Provider:   Lewayne Bunting, MD{or one of the following Advanced Practice Providers on your designated Care Team:   Francis Dowse, [place] [address] [place], [place] Earnest Rosier, NP    Important Information About Sugar

## 2022-12-02 ENCOUNTER — Ambulatory Visit: Payer: Medicare Other | Admitting: Internal Medicine

## 2023-01-05 ENCOUNTER — Encounter: Payer: Self-pay | Admitting: Adult Health

## 2023-01-05 ENCOUNTER — Ambulatory Visit (INDEPENDENT_AMBULATORY_CARE_PROVIDER_SITE_OTHER): Payer: Medicare Other | Admitting: Adult Health

## 2023-01-05 DIAGNOSIS — F4312 Post-traumatic stress disorder, chronic: Secondary | ICD-10-CM

## 2023-01-05 DIAGNOSIS — F902 Attention-deficit hyperactivity disorder, combined type: Secondary | ICD-10-CM

## 2023-01-05 DIAGNOSIS — F411 Generalized anxiety disorder: Secondary | ICD-10-CM

## 2023-01-05 DIAGNOSIS — F3110 Bipolar disorder, current episode manic without psychotic features, unspecified: Secondary | ICD-10-CM

## 2023-01-05 MED ORDER — ALPRAZOLAM 1 MG PO TABS: 1.0000 mg | ORAL_TABLET | Freq: Two times a day (BID) | ORAL | 2 refills | Status: DC | PRN

## 2023-01-05 NOTE — Progress Notes (Signed)
Paul Simmons [id number] 1980-05-29 42 y.o.  Subjective:   Patient ID:  Paul Simmons is a 42 y.o. (DOB [birth year]) male.  Chief Complaint: No chief complaint on file.   HPI Paul Simmons presents to the office today for follow-up of ADHD, GAD, BPD-1 and PTSD.   Describes mood today as "not the best". Pleasant. Mood symptoms - report depression, anxiety and irritability at times - "more situational". Denies panic attacks. Reports feeling overwhelmed. Reports some worry, rumination and over thinking - situational stressors with father's recent hospitalization - now in rehabilitation facility. Mood is variable. Feels like medications continue to work well. Varying interest and motivation. Taking medications as prescribed.  Energy levels stable. Active, does not have a regular exercise routine.  Enjoys some usual interests and activities. Married. 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 Family in area. Spending time with family. Appetite adequate. Weight stable - 291 pounds. Sleeping well most nights. Averages 8 total hours.  Focus and concentration stable. Completing tasks. Managing aspects of household. Disabled since 2008. Denies SI or HI.  Denies AH or VH. Denies self harm. Denies substance use.  Previous medication trials: Unknown   Review of Systems:  Review of Systems  Musculoskeletal:  Negative for gait problem.  Neurological:  Negative for tremors.  Psychiatric/Behavioral:         Please refer to HPI    Medications: I have reviewed the patient's current medications.  Current Outpatient Medications  Medication Sig Dispense Refill   ALPRAZolam (XANAX) 1 MG tablet Take 1 tablet (1 mg total) by mouth 2 (two) times daily as needed for anxiety or sleep. 60 tablet 2   divalproex (DEPAKOTE ER) 500 MG 24 hr tablet Take 4 tablets (2,000 mg total) by mouth at bedtime. 360 tablet 3   lamoTRIgine (LAMICTAL) 150 MG tablet Take 2 tablets (300 mg total) by mouth at bedtime. 180 tablet  3   levothyroxine (SYNTHROID, LEVOTHROID) 50 MCG tablet Take 50 mcg by mouth daily before breakfast.     metoprolol succinate (TOPROL-XL) 25 MG 24 hr tablet Take 1 tablet (25 mg total) by mouth daily. 90 tablet 3   rosuvastatin (CRESTOR) 10 MG tablet Take 1 tablet (10 mg total) by mouth daily. 90 tablet 3   No current facility-administered medications for this visit.    Medication Side Effects: None  Allergies: No Known Allergies  Past Medical History:  Diagnosis Date   Bipolar 1 disorder (HCC)    Bipolar disorder (HCC) 05/14/2016   Bipolar I disorder, most recent episode (or current) manic (HCC) 01/22/2018   Chronic cholecystitis with calculus 05/14/2016   Hyperlipidemia    Hypothyroidism    OSA (obstructive sleep apnea) 05/14/2016   He has a CPAP machine but does not use it.   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   Please see review of systems for further details on the patient's review from today.   Objective:   Physical Exam:  There were no vitals taken for this visit.  Physical Exam Constitutional:      General: He is not in acute distress. Musculoskeletal:        General: No deformity.  Neurological:     Mental Status: He is alert and oriented to person, place, and time.     Coordination: Coordination normal.  Psychiatric:  Attention and Perception: Attention and perception normal. He does not perceive auditory or visual hallucinations.        Mood and Affect: Mood normal. Mood is not anxious or depressed. Affect is not labile, blunt, angry or inappropriate.        Speech: Speech normal.        Behavior: Behavior normal.        Thought Content: Thought content normal. Thought content is not paranoid or delusional. Thought content does not include homicidal or suicidal ideation. Thought content  does not include homicidal or suicidal plan.        Cognition and Memory: Cognition and memory normal.        Judgment: Judgment normal.     Comments: Insight intact     Lab Review:     Component Value Date/Time   NA 134 (L) 05/23/2018 1842   K 3.5 05/23/2018 1842   CL 99 05/23/2018 1842   CO2 25 05/23/2018 1842   GLUCOSE 92 05/23/2018 1842   BUN 8 05/23/2018 1842   CREATININE 1.36 (H) 05/23/2018 1842   CALCIUM 9.2 05/23/2018 1842   PROT 6.6 05/16/2016 0511   ALBUMIN 3.8 05/16/2016 0511   AST 39 05/16/2016 0511   ALT 55 05/16/2016 0511   ALKPHOS 52 05/16/2016 0511   BILITOT 0.6 05/16/2016 0511   GFRNONAA >60 05/23/2018 1842   GFRAA >60 05/23/2018 1842       Component Value Date/Time   WBC 5.4 05/23/2018 1842   RBC 5.29 05/23/2018 1842   HGB 15.4 05/23/2018 1842   HCT 45.8 05/23/2018 1842   PLT 182 05/23/2018 1842   MCV 86.6 05/23/2018 1842   MCH 29.1 05/23/2018 1842   MCHC 33.6 05/23/2018 1842   RDW 13.0 05/23/2018 1842   LYMPHSABS 1.7 05/23/2018 1842   MONOABS 0.7 05/23/2018 1842   EOSABS 0.1 05/23/2018 1842   BASOSABS 0.0 05/23/2018 1842    No results found for: "POCLITH", "LITHIUM"   Lab Results  Component Value Date   VALPROATE 57.9 12/29/2007     .res Assessment: Plan:    Plan:  PDMP reviewed  1. Lamictal 150mg  - 2 at bedtime. 2. Depakote ER 500mg  - 4 at bedtime 3. Xanax 1mg  BID  Lab slip given for Depakote level.  RTC 3 months  Patient advised to contact office with any questions, adverse effects, or acute worsening in signs and symptoms.  Discussed potential benefits, risk, and side effects of benzodiazepines to include potential risk of tolerance and dependence, as well as possible drowsiness.  Advised patient not to drive if experiencing drowsiness and to take lowest possible effective dose to minimize risk of dependence and tolerance.   Counseled patient regarding potential benefits, risks, and side effects of Lamictal to include  potential risk of Stevens-Johnson syndrome. Advised patient to stop taking Lamictal and contact office immediately if rash develops and to seek urgent medical attention if rash is severe and/or spreading quickly.   Diagnoses and all orders for this visit:  Bipolar I disorder, most recent episode (or current) manic (HCC) -     Valproic acid level  Chronic post-traumatic stress disorder -     ALPRAZolam (XANAX) 1 MG tablet; Take 1 tablet (1 mg total) by mouth 2 (two) times daily as needed for anxiety or sleep.  Attention deficit hyperactivity disorder, combined type  Generalized anxiety disorder     Please see After Visit Summary for patient specific instructions.  Future Appointments  Date Time Provider Department Center  01/19/2023  4:20 PM Pricilla Riffle, MD [facility]  04/20/2023  3:45 PM Marinus Maw, MD [facility]    Orders Placed This Encounter  Procedures   Valproic acid level    -------------------------------

## 2023-01-17 NOTE — Progress Notes (Unsigned)
Cardiology Office Note   Date:  01/19/2023   ID:  Paul Simmons, DOB 1980-04-01, MRN [id number]  PCP:  Toma Deiters, MD  Cardiologist:   Dietrich Pates, MD   Pt referred for evaluation of tachycardia     History of Present Illness: Paul Simmons is a 42 y.o. male with a history of OSA(not using CPAP), PTSD, HL, and SVT   Pt has had episodes of tachycardia with HRs up to 170s/190s  Last for about 7 min   I saw him fro the first time in Aug 2024 He wore a Zio patch that demonstrated SVT for the first time    Referred to [facility]   Given short duration and lack of hemodynamic decompensation, pt elected to continue with medical therapy   Given Rx for Toprol XL   Since his last visit he says the episodes are less frequent   But, they last a little longer   usually occur when he is not doing anything    This morning in bed had one that lasted 10 min No dizziness  Felt like he had run a race          Current Meds  Medication Sig   ALPRAZolam (XANAX) 1 MG tablet Take 1 tablet (1 mg total) by mouth 2 (two) times daily as needed for anxiety or sleep.   divalproex (DEPAKOTE ER) 500 MG 24 hr tablet Take 4 tablets (2,000 mg total) by mouth at bedtime.   lamoTRIgine (LAMICTAL) 150 MG tablet Take 2 tablets (300 mg total) by mouth at bedtime.   levothyroxine (SYNTHROID, LEVOTHROID) 50 MCG tablet Take 50 mcg by mouth daily before breakfast.   metoprolol succinate (TOPROL-XL) 25 MG 24 hr tablet Take 1 tablet (25 mg total) by mouth daily.   rosuvastatin (CRESTOR) 10 MG tablet Take 1 tablet (10 mg total) by mouth daily.     Allergies:   Patient has no known allergies.   Past Medical History:  Diagnosis Date   Bipolar 1 disorder (HCC)    Bipolar disorder (HCC) 05/14/2016   Bipolar I disorder, most recent episode (or current) manic (HCC) 01/22/2018   Chronic cholecystitis with calculus 05/14/2016   Hyperlipidemia    Hypothyroidism    OSA (obstructive sleep apnea) 05/14/2016   He has a CPAP  machine but does not use it.   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[employer]    Past Surgical History:  Procedure Laterality Date   CHOLECYSTECTOMY N/A 05/15/2016   Procedure: LAPAROSCOPIC CHOLECYSTECTOMY WITH INTRAOPERATIVE CHOLANGIOGRAM;  Surgeon: Luretha Murphy, MD;  Location: [facility];  Service: General;  Laterality: N/A;   left varicose vein surgery     REPAIR OF PERONEUS BREVIS TENDON Left 05/12/2019   Procedure: Left peroneal tendon debridement and repair, calcaneous lateral wall exostectomy;  Surgeon: Toni Arthurs, MD;  Location: [facility];  Service: Orthopedics;  Laterality: Left;   Right Ankle Surgery     x 6 surgeries   TONSILLECTOMY       Social History:  The patient  reports that he has never smoked. He has quit using smokeless tobacco.  His smokeless tobacco use included chew. He reports current alcohol use. He reports that he does not use drugs.   Family History:  The patient's family history is not on file.    ROS:  Please see the history of present illness. All other systems are reviewed and  Negative to the above  problem except as noted.    PHYSICAL EXAM: VS:  BP 118/80   Pulse 66   Ht 6' (1.829 m)   Wt 292 lb 12.8 oz (132.8 kg)   SpO2 96%   BMI 39.71 kg/m   GEN: Obese 42 yo  in no acute distress  HEENT: normal  Neck: no JVD, carotid bruits  Cardiac: RRR; no murmurs  No LE edema  Respiratory:  clear to auscultation bilaterally GI: soft, nontender   No hepatomegaly   EKG:  EKG is not ordered today.   Lipid Panel No results found for: "CHOL", "TRIG", "HDL", "CHOLHDL", "VLDL", "LDLCALC", "LDLDIRECT"    Wt Readings from Last 3 Encounters:  01/19/23 292 lb 12.8 oz (132.8 kg)  12/01/22 292 lb (132.5 kg)  09/29/22 291 lb 6.4 oz (132.2 kg)      ASSESSMENT AND PLAN:  1 SVT  Pt with SVT as documented on Zio patch    Episodes are relatively short   He appears to be tolerating   Occurring less often  than previous  Recomm   Keep on Toprol XL    Avoid excess caffeine Stay hydrated  Reviewed vagal maneuvers Call if changes      2  Lipids   Pt recently started on statin   Will get labs today  LDL in Aug was 130 with high ApoB       Plan for follow up next fall  Call sooner for problems     Current medicines are reviewed at length with the patient today.  The patient does not have concerns regarding medicines.  Signed, Dietrich Pates, MD  01/19/2023 4:30 PM    [facility] [address] [place], [place], [place]  [address] Phone: [phone]; Fax: [phone]

## 2023-01-19 ENCOUNTER — Encounter: Payer: Self-pay | Admitting: Internal Medicine

## 2023-01-19 ENCOUNTER — Ambulatory Visit: Payer: Medicare Other | Attending: Internal Medicine | Admitting: Internal Medicine

## 2023-01-19 VITALS — BP 118/80 | HR 66 | Ht 72.0 in | Wt 292.8 lb

## 2023-01-19 DIAGNOSIS — Z79899 Other long term (current) drug therapy: Secondary | ICD-10-CM

## 2023-01-19 DIAGNOSIS — E7800 Pure hypercholesterolemia, unspecified: Secondary | ICD-10-CM

## 2023-01-19 NOTE — Patient Instructions (Signed)
Medication Instructions:  Your physician recommends that you continue on your current medications as directed. Please refer to the Current Medication list given to you today.  *If you need a refill on your cardiac medications before your next appointment, please call your pharmacy*   Lab Work: Please complete a lipomed panel, a liver panel and an apolipoprotein b today at the [facility] on the first floor of our building before your leave.  If you have labs (blood work) drawn today and your tests are completely normal, you will receive your results only by: MyChart Message (if you have MyChart) OR A paper copy in the mail If you have any lab test that is abnormal or we need to change your treatment, we will call you to review the results.   Testing/Procedures: None.   Follow-Up: At [facility], you and your health needs are our priority.  As part of our continuing mission to provide you with exceptional heart care, we have created designated Provider Care Teams.  These Care Teams include your primary Cardiologist (physician) and Advanced Practice Providers (APPs -  Physician Assistants and Nurse Practitioners) who all work together to provide you with the care you need, when you need it.  We recommend signing up for the patient portal called "MyChart".  Sign up information is provided on this After Visit Summary.  MyChart is used to connect with patients for Virtual Visits (Telemedicine).  Patients are able to view lab/test results, encounter notes, upcoming appointments, etc.  Non-urgent messages can be sent to your provider as well.   To learn more about what you can do with MyChart, go to [email or web address].    Your next appointment:   10 month(s) (Due October 2025)  Provider:   Dr. Dietrich Pates, MD

## 2023-01-20 LAB — NMR, LIPOPROFILE
Cholesterol, Total: 155 mg/dL (ref 100–199)
HDL Particle Number: 33.2 umol/L (ref >=30.5)
HDL-C: 39 mg/dL — ABNORMAL LOW (ref >39)
LDL Particle Number: 961 nmol/L (ref <1000)
LDL Size: 20.5 nmol — ABNORMAL LOW (ref >20.5)
LDL-C (NIH Calc): 87 mg/dL (ref 0–99)
LP-IR Score: 54 — ABNORMAL HIGH (ref <=45)
Small LDL Particle Number: 372 nmol/L (ref <=527)
Triglycerides: 169 mg/dL — ABNORMAL HIGH (ref 0–149)

## 2023-01-20 LAB — HEPATIC FUNCTION PANEL
ALT: 53 [IU]/L — ABNORMAL HIGH (ref 0–44)
AST: 39 [IU]/L (ref 0–40)
Albumin: 4.8 g/dL (ref 4.1–5.1)
Alkaline Phosphatase: 75 [IU]/L (ref 44–121)
Bilirubin Total: 0.3 mg/dL (ref 0.0–1.2)
Bilirubin, Direct: 0.14 mg/dL (ref 0.00–0.40)
Total Protein: 7.6 g/dL (ref 6.0–8.5)

## 2023-01-20 LAB — APOLIPOPROTEIN B: Apolipoprotein B: 84 mg/dL (ref <90)

## 2023-02-13 ENCOUNTER — Ambulatory Visit: Payer: Medicare Other | Admitting: Mental Health

## 2023-04-01 ENCOUNTER — Ambulatory Visit (INDEPENDENT_AMBULATORY_CARE_PROVIDER_SITE_OTHER): Payer: Medicare Other | Admitting: Adult Health

## 2023-04-01 ENCOUNTER — Encounter: Payer: Self-pay | Admitting: Adult Health

## 2023-04-01 DIAGNOSIS — F411 Generalized anxiety disorder: Secondary | ICD-10-CM

## 2023-04-01 DIAGNOSIS — Z79899 Other long term (current) drug therapy: Secondary | ICD-10-CM

## 2023-04-01 DIAGNOSIS — F4312 Post-traumatic stress disorder, chronic: Secondary | ICD-10-CM | POA: Diagnosis not present

## 2023-04-01 DIAGNOSIS — F3110 Bipolar disorder, current episode manic without psychotic features, unspecified: Secondary | ICD-10-CM

## 2023-04-01 MED ORDER — ALPRAZOLAM 1 MG PO TABS: 1.0000 mg | ORAL_TABLET | Freq: Two times a day (BID) | ORAL | 2 refills | Status: DC | PRN

## 2023-04-01 NOTE — Progress Notes (Signed)
Paul Simmons [id number] [birth year] 43 y.o.  Subjective:   Patient ID:  Paul Simmons is a 43 y.o. (DOB 01-19-1981) male.  Chief Complaint: No chief complaint on file.   HPI Paul Simmons presents to the office today for follow-up of ADHD, GAD, BPD-1 and PTSD.   Describes mood today as "ok". Pleasant. Mood symptoms - report depression, anxiety and irritability over the past month - increased situational stressors. Denies recent panic attacks. Reports feeling overwhelmed at times. Reports some worry, rumination and over thinking. Reports his father and friend passed away. Also has another friend who is expected to pass soon. Mood is variable. Feels like medications  work well. Varying interest and motivation. Taking medications as prescribed.  Energy levels lower with recent illness Active, does not have a regular exercise routine.  Enjoys some usual interests and activities. Married. 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 Spending time with family. Appetite adequate. Weight stable - 291 pounds. Sleeping better some nights than others.  Focus and concentration stable. Completing tasks. Managing aspects of household. Disabled since 2008. Denies SI or HI.  Denies AH or VH. Denies self harm. Denies substance use.  Previous medication trials: Unknown  Review of Systems:  Review of Systems  Musculoskeletal:  Negative for gait problem.  Neurological:  Negative for tremors.  Psychiatric/Behavioral:         Please refer to HPI    Medications: I have reviewed the patient's current medications.  Current Outpatient Medications  Medication Sig Dispense Refill   ALPRAZolam (XANAX) 1 MG tablet Take 1 tablet (1 mg total) by mouth 2 (two) times daily as needed for anxiety or sleep. 60 tablet 2   divalproex (DEPAKOTE ER) 500 MG 24 hr tablet Take 4 tablets (2,000 mg total) by mouth at bedtime. 360 tablet 3   lamoTRIgine (LAMICTAL) 150 MG tablet Take 2 tablets (300 mg total) by mouth at  bedtime. 180 tablet 3   levothyroxine (SYNTHROID, LEVOTHROID) 50 MCG tablet Take 50 mcg by mouth daily before breakfast.     metoprolol succinate (TOPROL-XL) 25 MG 24 hr tablet Take 1 tablet (25 mg total) by mouth daily. 90 tablet 3   rosuvastatin (CRESTOR) 10 MG tablet Take 1 tablet (10 mg total) by mouth daily. 90 tablet 3   No current facility-administered medications for this visit.    Medication Side Effects: None  Allergies: No Known Allergies  Past Medical History:  Diagnosis Date   Bipolar 1 disorder (HCC)    Bipolar disorder (HCC) 05/14/2016   Bipolar I disorder, most recent episode (or current) manic (HCC) 01/22/2018   Chronic cholecystitis with calculus 05/14/2016   Hyperlipidemia    Hypothyroidism    OSA (obstructive sleep apnea) 05/14/2016   He has a CPAP machine but does not use it.   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   Please see review of systems for further details on the patient's review from today.   Objective:   Physical Exam:  There were no vitals taken for this visit.  Physical Exam Constitutional:      General: He is not in acute distress. Musculoskeletal:        General: No deformity.  Neurological:     Mental Status: He is alert and oriented to person, place, and time.     Coordination: Coordination normal.  Psychiatric:  Attention and Perception: Attention and perception normal. He does not perceive auditory or visual hallucinations.        Mood and Affect: Affect is not labile, blunt, angry or inappropriate.        Speech: Speech normal.        Behavior: Behavior normal.        Thought Content: Thought content normal. Thought content is not paranoid or delusional. Thought content does not include homicidal or suicidal ideation. Thought content does not include homicidal  or suicidal plan.        Cognition and Memory: Cognition and memory normal.        Judgment: Judgment normal.     Comments: Insight intact     Lab Review:     Component Value Date/Time   NA 134 (L) 05/23/2018 1842   K 3.5 05/23/2018 1842   CL 99 05/23/2018 1842   CO2 25 05/23/2018 1842   GLUCOSE 92 05/23/2018 1842   BUN 8 05/23/2018 1842   CREATININE 1.36 (H) 05/23/2018 1842   CALCIUM 9.2 05/23/2018 1842   PROT 7.6 01/19/2023 1704   ALBUMIN 4.8 01/19/2023 1704   AST 39 01/19/2023 1704   ALT 53 (H) 01/19/2023 1704   ALKPHOS 75 01/19/2023 1704   BILITOT 0.3 01/19/2023 1704   GFRNONAA >60 05/23/2018 1842   GFRAA >60 05/23/2018 1842       Component Value Date/Time   WBC 5.4 05/23/2018 1842   RBC 5.29 05/23/2018 1842   HGB 15.4 05/23/2018 1842   HCT 45.8 05/23/2018 1842   PLT 182 05/23/2018 1842   MCV 86.6 05/23/2018 1842   MCH 29.1 05/23/2018 1842   MCHC 33.6 05/23/2018 1842   RDW 13.0 05/23/2018 1842   LYMPHSABS 1.7 05/23/2018 1842   MONOABS 0.7 05/23/2018 1842   EOSABS 0.1 05/23/2018 1842   BASOSABS 0.0 05/23/2018 1842    No results found for: "POCLITH", "LITHIUM"   Lab Results  Component Value Date   VALPROATE 57.9 12/29/2007     .res Assessment: Plan:    Plan:  PDMP reviewed  1. Lamictal 150mg  - 2 at bedtime. 2. Depakote ER 500mg  - 4 at bedtime 3. Xanax 1mg  BID  Lab slip given for Depakote level.  RTC 3 months  Patient advised to contact office with any questions, adverse effects, or acute worsening in signs and symptoms.  Discussed potential benefits, risk, and side effects of benzodiazepines to include potential risk of tolerance and dependence, as well as possible drowsiness.  Advised patient not to drive if experiencing drowsiness and to take lowest possible effective dose to minimize risk of dependence and tolerance.   Counseled patient regarding potential benefits, risks, and side effects of Lamictal to include potential risk of  Stevens-Johnson syndrome. Advised patient to stop taking Lamictal and contact office immediately if rash develops and to seek urgent medical attention if rash is severe and/or spreading quickly.   There are no diagnoses linked to this encounter.   Please see After Visit Summary for patient specific instructions.  Future Appointments  Date Time Provider Department Center  04/20/2023  3:45 PM Marinus Maw, MD [facility]    No orders of the defined types were placed in this encounter.   -------------------------------

## 2023-04-04 DIAGNOSIS — R030 Elevated blood-pressure reading, without diagnosis of hypertension: Secondary | ICD-10-CM | POA: Diagnosis not present

## 2023-04-04 DIAGNOSIS — J019 Acute sinusitis, unspecified: Secondary | ICD-10-CM | POA: Diagnosis not present

## 2023-04-04 DIAGNOSIS — J029 Acute pharyngitis, unspecified: Secondary | ICD-10-CM | POA: Diagnosis not present

## 2023-04-04 DIAGNOSIS — Z6841 Body Mass Index (BMI) 40.0 and over, adult: Secondary | ICD-10-CM | POA: Diagnosis not present

## 2023-04-20 ENCOUNTER — Ambulatory Visit: Payer: Medicare Other | Admitting: Physician Assistant

## 2023-04-20 ENCOUNTER — Ambulatory Visit: Payer: Medicare Other | Attending: Physician Assistant | Admitting: Internal Medicine

## 2023-04-20 ENCOUNTER — Encounter: Payer: Self-pay | Admitting: Internal Medicine

## 2023-04-20 VITALS — BP 116/78 | HR 76 | Ht 72.0 in | Wt 301.0 lb

## 2023-04-20 DIAGNOSIS — E782 Mixed hyperlipidemia: Secondary | ICD-10-CM | POA: Diagnosis not present

## 2023-04-20 DIAGNOSIS — Z0000 Encounter for general adult medical examination without abnormal findings: Secondary | ICD-10-CM | POA: Diagnosis not present

## 2023-04-20 DIAGNOSIS — I471 Supraventricular tachycardia, unspecified: Secondary | ICD-10-CM | POA: Diagnosis not present

## 2023-04-20 DIAGNOSIS — G4730 Sleep apnea, unspecified: Secondary | ICD-10-CM | POA: Diagnosis not present

## 2023-04-20 DIAGNOSIS — I4719 Other supraventricular tachycardia: Secondary | ICD-10-CM | POA: Diagnosis not present

## 2023-04-20 DIAGNOSIS — F3131 Bipolar disorder, current episode depressed, mild: Secondary | ICD-10-CM | POA: Diagnosis not present

## 2023-04-20 DIAGNOSIS — N182 Chronic kidney disease, stage 2 (mild): Secondary | ICD-10-CM | POA: Diagnosis not present

## 2023-04-20 DIAGNOSIS — Z79899 Other long term (current) drug therapy: Secondary | ICD-10-CM | POA: Diagnosis not present

## 2023-04-20 DIAGNOSIS — Z1331 Encounter for screening for depression: Secondary | ICD-10-CM | POA: Diagnosis not present

## 2023-04-20 DIAGNOSIS — I10 Essential (primary) hypertension: Secondary | ICD-10-CM | POA: Diagnosis not present

## 2023-04-20 DIAGNOSIS — E038 Other specified hypothyroidism: Secondary | ICD-10-CM | POA: Diagnosis not present

## 2023-04-20 DIAGNOSIS — E0500 Thyrotoxicosis with diffuse goiter without thyrotoxic crisis or storm: Secondary | ICD-10-CM | POA: Diagnosis not present

## 2023-04-20 NOTE — Patient Instructions (Signed)
 Medication Instructions:  Your physician recommends that you continue on your current medications as directed. Please refer to the Current Medication list given to you today.  *If you need a refill on your cardiac medications before your next appointment, please call your pharmacy*   Lab Work: NONE   If you have labs (blood work) drawn today and your tests are completely normal, you will receive your results only by: MyChart Message (if you have MyChart) OR A paper copy in the mail If you have any lab test that is abnormal or we need to change your treatment, we will call you to review the results.   Testing/Procedures: NONE    Follow-Up: At [facility], you and your health needs are our priority.  As part of our continuing mission to provide you with exceptional heart care, we have created designated Provider Care Teams.  These Care Teams include your primary Cardiologist (physician) and Advanced Practice Providers (APPs -  Physician Assistants and Nurse Practitioners) who all work together to provide you with the care you need, when you need it.  We recommend signing up for the patient portal called "MyChart".  Sign up information is provided on this After Visit Summary.  MyChart is used to connect with patients for Virtual Visits (Telemedicine).  Patients are able to view lab/test results, encounter notes, upcoming appointments, etc.  Non-urgent messages can be sent to your provider as well.   To learn more about what you can do with MyChart, go to [email or web address].    Your next appointment:    As Needed   Provider:   Lewayne Bunting, MD    Other Instructions Thank you for choosing [facility]!

## 2023-04-20 NOTE — Progress Notes (Signed)
 HPI [name] is referred by Dr. Tenny Craw for evaluation of SVT. He is a pleasant 43 yo man with a h/o dyslipidemia and thyroid dysfunction. He has had palpitations for several months which start and stop suddenly. No syncope. He notes that he will sweat when the heart races. He denies chest pain or sob. He notes that the spells always stop within 10 minutes. He takes toprol and notes that the spells occur about 3 times a month.  No Known Allergies   Current Outpatient Medications  Medication Sig Dispense Refill   ALPRAZolam (XANAX) 1 MG tablet Take 1 tablet (1 mg total) by mouth 2 (two) times daily as needed for anxiety or sleep. 60 tablet 2   divalproex (DEPAKOTE ER) 500 MG 24 hr tablet Take 4 tablets (2,000 mg total) by mouth at bedtime. 360 tablet 3   gabapentin (NEURONTIN) 100 MG capsule TAKE 1 CAPSULE BY MOUTH 3 TIMES A DAY AS NEEDED     lamoTRIgine (LAMICTAL) 150 MG tablet Take 2 tablets (300 mg total) by mouth at bedtime. 180 tablet 3   levothyroxine (SYNTHROID, LEVOTHROID) 50 MCG tablet Take 50 mcg by mouth daily before breakfast.     metoprolol succinate (TOPROL-XL) 25 MG 24 hr tablet Take 1 tablet (25 mg total) by mouth daily. 90 tablet 3   rosuvastatin (CRESTOR) 10 MG tablet Take 1 tablet (10 mg total) by mouth daily. 90 tablet 3   No current facility-administered medications for this visit.     Past Medical History:  Diagnosis Date   Bipolar 1 disorder (HCC)    Bipolar disorder (HCC) 05/14/2016   Bipolar I disorder, most recent episode (or current) manic (HCC) 01/22/2018   Chronic cholecystitis with calculus 05/14/2016   Hyperlipidemia    Hypothyroidism    OSA (obstructive sleep apnea) 05/14/2016   He has a CPAP machine but does not use it.   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   Past Surgical History:  Procedure Laterality Date    CHOLECYSTECTOMY N/A 05/15/2016   Procedure: LAPAROSCOPIC CHOLECYSTECTOMY WITH INTRAOPERATIVE CHOLANGIOGRAM;  Surgeon: Luretha Murphy, MD;  Location: [facility];  Service: General;  Laterality: N/A;   left varicose vein surgery     REPAIR OF PERONEUS BREVIS TENDON Left 05/12/2019   Procedure: Left peroneal tendon debridement and repair, calcaneous lateral wall exostectomy;  Surgeon: Toni Arthurs, MD;  Location: [facility];  Service: Orthopedics;  Laterality: Left;   Right Ankle Surgery     x 6 surgeries   TONSILLECTOMY       History reviewed. No pertinent family history.   Social History   Socioeconomic History   Marital status: Married    Spouse name: Not on file   Number of children: Not on file   Years of education: Not on file   Highest education level: Not on file  Occupational History   Not on file  Tobacco Use   Smoking status: Never   Smokeless tobacco: Former    Types: Designer, multimedia Use   Vaping status: Never Used  Substance and Sexual Activity   Alcohol use: Yes    Comment: occassionally   Drug use: No   Sexual activity: Yes  Other Topics Concern   Not on file  Social History Narrative   Not on file   Social Drivers of Health  Financial Resource Strain: Not on file  Food Insecurity: Not on file  Transportation Needs: Not on file  Physical Activity: Not on file  Stress: Not on file  Social Connections: Unknown (06/25/2021)   Received from [facility], [facility]   Social Network    Social Network: Not on file  Intimate Partner Violence: Unknown (05/17/2021)   Received from [facility], [facility]   HITS    Physically Hurt: Not on file    Insult or Talk Down To: Not on file    Threaten Physical Harm: Not on file    Scream or Curse: Not on file     BP 116/78 (BP Location: Right Arm, Patient Position: Sitting)   Pulse 76   Ht 6' (1.829 m)   Wt (!) 301 lb (136.5 kg)   SpO2 97%   BMI 40.82 kg/m   Physical Exam:  Well appearing  NAD HEENT: Unremarkable Neck:  No JVD, no thyromegally Lymphatics:  No adenopathy Back:  No CVA tenderness Lungs:  Clear with no wheezes HEART:  Regular rate rhythm, no murmurs, no rubs, no clicks Abd:  soft, positive bowel sounds, no organomegally, no rebound, no guarding Ext:  2 plus pulses, no edema, no cyanosis, no clubbing Skin:  No rashes no nodules Neuro:  CN II through XII intact, motor grossly intact   Assess/Plan:  SVT - I have discussed the risks/benefits/goals/expectations of EP study and catheter ablation. He would like to try medical therapy. We will continue low dose toprol, with plans to titrate up as needed. Sleep apnea - this is contributing however he has been non-compliant with his CPAP. I have encouraged him. Obesity - he is encouraged to lose weight.  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 Continue.    Sharlot Gowda Sincere Berlanga,MD

## 2023-05-29 ENCOUNTER — Other Ambulatory Visit: Payer: Self-pay | Admitting: Adult Health

## 2023-05-29 DIAGNOSIS — F3110 Bipolar disorder, current episode manic without psychotic features, unspecified: Secondary | ICD-10-CM

## 2023-05-29 DIAGNOSIS — F4312 Post-traumatic stress disorder, chronic: Secondary | ICD-10-CM

## 2023-06-23 ENCOUNTER — Ambulatory Visit (INDEPENDENT_AMBULATORY_CARE_PROVIDER_SITE_OTHER): Payer: Medicare Other | Admitting: Adult Health

## 2023-06-23 DIAGNOSIS — Z0389 Encounter for observation for other suspected diseases and conditions ruled out: Secondary | ICD-10-CM

## 2023-06-23 NOTE — Progress Notes (Signed)
 Patient no show appointment. ? ?

## 2023-08-16 ENCOUNTER — Other Ambulatory Visit: Payer: Self-pay | Admitting: Adult Health

## 2023-08-16 DIAGNOSIS — F3110 Bipolar disorder, current episode manic without psychotic features, unspecified: Secondary | ICD-10-CM

## 2023-08-16 DIAGNOSIS — F4312 Post-traumatic stress disorder, chronic: Secondary | ICD-10-CM

## 2023-08-27 ENCOUNTER — Other Ambulatory Visit: Payer: Self-pay | Admitting: Adult Health

## 2023-08-27 DIAGNOSIS — F4312 Post-traumatic stress disorder, chronic: Secondary | ICD-10-CM

## 2023-08-27 DIAGNOSIS — F3110 Bipolar disorder, current episode manic without psychotic features, unspecified: Secondary | ICD-10-CM

## 2023-08-27 NOTE — Telephone Encounter (Signed)
 Please call to schedule FU. Was a no show for June appt.

## 2023-08-28 NOTE — Telephone Encounter (Signed)
 Apt 9/15 needs in office

## 2023-09-26 ENCOUNTER — Other Ambulatory Visit: Payer: Self-pay | Admitting: Adult Health

## 2023-09-26 DIAGNOSIS — R1031 Right lower quadrant pain: Secondary | ICD-10-CM | POA: Diagnosis not present

## 2023-09-26 DIAGNOSIS — R1030 Lower abdominal pain, unspecified: Secondary | ICD-10-CM | POA: Diagnosis not present

## 2023-09-26 DIAGNOSIS — F3110 Bipolar disorder, current episode manic without psychotic features, unspecified: Secondary | ICD-10-CM

## 2023-09-26 DIAGNOSIS — E079 Disorder of thyroid, unspecified: Secondary | ICD-10-CM | POA: Diagnosis not present

## 2023-09-26 DIAGNOSIS — I10 Essential (primary) hypertension: Secondary | ICD-10-CM | POA: Diagnosis not present

## 2023-09-26 DIAGNOSIS — E860 Dehydration: Secondary | ICD-10-CM | POA: Diagnosis not present

## 2023-09-26 DIAGNOSIS — E785 Hyperlipidemia, unspecified: Secondary | ICD-10-CM | POA: Diagnosis not present

## 2023-09-26 DIAGNOSIS — F4312 Post-traumatic stress disorder, chronic: Secondary | ICD-10-CM

## 2023-09-26 DIAGNOSIS — R1032 Left lower quadrant pain: Secondary | ICD-10-CM | POA: Diagnosis not present

## 2023-09-27 ENCOUNTER — Other Ambulatory Visit: Payer: Self-pay | Admitting: Adult Health

## 2023-09-27 DIAGNOSIS — F4312 Post-traumatic stress disorder, chronic: Secondary | ICD-10-CM

## 2023-09-27 DIAGNOSIS — F3110 Bipolar disorder, current episode manic without psychotic features, unspecified: Secondary | ICD-10-CM

## 2023-09-29 DIAGNOSIS — R1084 Generalized abdominal pain: Secondary | ICD-10-CM | POA: Diagnosis not present

## 2023-10-26 ENCOUNTER — Ambulatory Visit (INDEPENDENT_AMBULATORY_CARE_PROVIDER_SITE_OTHER): Payer: Self-pay | Admitting: Adult Health

## 2023-10-26 DIAGNOSIS — Z0389 Encounter for observation for other suspected diseases and conditions ruled out: Secondary | ICD-10-CM

## 2023-10-26 NOTE — Progress Notes (Signed)
 Patient no show appointment. ? ?

## 2023-10-31 ENCOUNTER — Other Ambulatory Visit: Payer: Self-pay | Admitting: Adult Health

## 2023-10-31 DIAGNOSIS — F3110 Bipolar disorder, current episode manic without psychotic features, unspecified: Secondary | ICD-10-CM

## 2023-10-31 DIAGNOSIS — F4312 Post-traumatic stress disorder, chronic: Secondary | ICD-10-CM

## 2023-11-12 ENCOUNTER — Other Ambulatory Visit: Payer: Self-pay | Admitting: Adult Health

## 2023-11-12 DIAGNOSIS — F3110 Bipolar disorder, current episode manic without psychotic features, unspecified: Secondary | ICD-10-CM

## 2023-11-12 DIAGNOSIS — F4312 Post-traumatic stress disorder, chronic: Secondary | ICD-10-CM

## 2023-11-24 ENCOUNTER — Ambulatory Visit (INDEPENDENT_AMBULATORY_CARE_PROVIDER_SITE_OTHER): Admitting: Adult Health

## 2023-11-24 ENCOUNTER — Encounter: Payer: Self-pay | Admitting: Adult Health

## 2023-11-24 DIAGNOSIS — F4312 Post-traumatic stress disorder, chronic: Secondary | ICD-10-CM

## 2023-11-24 DIAGNOSIS — F3110 Bipolar disorder, current episode manic without psychotic features, unspecified: Secondary | ICD-10-CM

## 2023-11-24 MED ORDER — DIVALPROEX SODIUM ER 500 MG PO TB24: 2000.0000 mg | ORAL_TABLET | Freq: Every day | ORAL | 2 refills | Status: AC

## 2023-11-24 MED ORDER — ALPRAZOLAM 1 MG PO TABS: 1.0000 mg | ORAL_TABLET | Freq: Two times a day (BID) | ORAL | 2 refills | Status: AC | PRN

## 2023-11-24 MED ORDER — LAMOTRIGINE 150 MG PO TABS: 300.0000 mg | ORAL_TABLET | Freq: Every day | ORAL | 2 refills | Status: DC

## 2023-11-24 NOTE — Progress Notes (Signed)
 RENLY ROOTS [id number] Apr 30, 1980 43 y.o.  Subjective:   Patient ID:  Paul Simmons is a 43 y.o. (DOB September 08, 1980) male.  Chief Complaint: No chief complaint on file.   HPI ZALEN SEQUEIRA presents to the office today for follow-up of ADHD, GAD, BPD-1 and PTSD.   Describes mood today as ok. Pleasant. Mood symptoms - denies depression, anxiety and irritability - situational stressors. Denies recent panic attacks. Denies feeling overwhelmed. Denies worry, rumination and over thinking. Reports mood is stable. Feels like medications work well. Taking medications as prescribed.  Energy levels lower with recent illness Active, does not have a regular exercise routine.  Enjoys some usual interests and activities. Married. 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 Spending time with family. Appetite adequate. Weight stable - 291 pounds. Sleeping better some nights than others.  Focus and concentration stable. Completing tasks. Managing aspects of household. Disabled since 2008. Denies SI or HI.  Denies AH or VH. Denies self harm. Denies substance use.  Previous medication trials: Unknown   Review of Systems:  Review of Systems  Musculoskeletal:  Negative for gait problem.  Neurological:  Negative for tremors.  Psychiatric/Behavioral:         Please refer to HPI    Medications: I have reviewed the patient's current medications.  Current Outpatient Medications  Medication Sig Dispense Refill   ALPRAZolam  (XANAX ) 1 MG tablet Take 1 tablet (1 mg total) by mouth 2 (two) times daily as needed for anxiety or sleep. 60 tablet 2   divalproex  (DEPAKOTE  ER) 500 MG 24 hr tablet Take 4 tablets (2,000 mg total) by mouth at bedtime. 120 tablet 2   gabapentin (NEURONTIN) 100 MG capsule TAKE 1 CAPSULE BY MOUTH 3 TIMES A DAY AS NEEDED     lamoTRIgine  (LAMICTAL ) 150 MG tablet Take 2 tablets (300 mg total) by mouth at bedtime. 60 tablet 2   levothyroxine  (SYNTHROID , LEVOTHROID) 50 MCG tablet Take 50  mcg by mouth daily before breakfast.     metoprolol  succinate (TOPROL -XL) 25 MG 24 hr tablet Take 1 tablet (25 mg total) by mouth daily. 90 tablet 3   rosuvastatin  (CRESTOR ) 10 MG tablet Take 1 tablet (10 mg total) by mouth daily. 90 tablet 3   No current facility-administered medications for this visit.    Medication Side Effects: None  Allergies: No Known Allergies  Past Medical History:  Diagnosis Date   Bipolar 1 disorder (HCC)    Bipolar disorder (HCC) 05/14/2016   Bipolar I disorder, most recent episode (or current) manic (HCC) 01/22/2018   Chronic cholecystitis with calculus 05/14/2016   Hyperlipidemia    Hypothyroidism    OSA (obstructive sleep apnea) 05/14/2016   He has a CPAP machine but does not use it.   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   Please see review of systems for further details on the patient's review from today.   Objective:   Physical Exam:  There were no vitals taken for this visit.  Physical Exam Constitutional:      General: He is not in acute distress. Musculoskeletal:        General: No deformity.  Neurological:     Mental Status: He is alert and oriented to person, place, and time.     Coordination: Coordination normal.  Psychiatric:        Attention  and Perception: Attention and perception normal. He does not perceive auditory or visual hallucinations.        Mood and Affect: Mood normal. Mood is not anxious or depressed. Affect is not labile, blunt, angry or inappropriate.        Speech: Speech normal.        Behavior: Behavior normal.        Thought Content: Thought content normal. Thought content is not paranoid or delusional. Thought content does not include homicidal or suicidal ideation. Thought content does not include homicidal or suicidal plan.        Cognition  and Memory: Cognition and memory normal.        Judgment: Judgment normal.     Comments: Insight intact     Lab Review:     Component Value Date/Time   NA 134 (L) 05/23/2018 1842   K 3.5 05/23/2018 1842   CL 99 05/23/2018 1842   CO2 25 05/23/2018 1842   GLUCOSE 92 05/23/2018 1842   BUN 8 05/23/2018 1842   CREATININE 1.36 (H) 05/23/2018 1842   CALCIUM  9.2 05/23/2018 1842   PROT 7.6 01/19/2023 1704   ALBUMIN 4.8 01/19/2023 1704   AST 39 01/19/2023 1704   ALT 53 (H) 01/19/2023 1704   ALKPHOS 75 01/19/2023 1704   BILITOT 0.3 01/19/2023 1704   GFRNONAA >60 05/23/2018 1842   GFRAA >60 05/23/2018 1842       Component Value Date/Time   WBC 5.4 05/23/2018 1842   RBC 5.29 05/23/2018 1842   HGB 15.4 05/23/2018 1842   HCT 45.8 05/23/2018 1842   PLT 182 05/23/2018 1842   MCV 86.6 05/23/2018 1842   MCH 29.1 05/23/2018 1842   MCHC 33.6 05/23/2018 1842   RDW 13.0 05/23/2018 1842   LYMPHSABS 1.7 05/23/2018 1842   MONOABS 0.7 05/23/2018 1842   EOSABS 0.1 05/23/2018 1842   BASOSABS 0.0 05/23/2018 1842    No results found for: POCLITH, LITHIUM   Lab Results  Component Value Date   VALPROATE 57.9 12/29/2007     .res Assessment: Plan:    Plan:  PDMP reviewed  1. Lamictal  150mg  - 2 at bedtime. 2. Depakote  ER 500mg  - 4 at bedtime 3. Xanax  1mg  BID  Depakote  level next visit  RTC 3 months  Patient advised to contact office with any questions, adverse effects, or acute worsening in signs and symptoms.  Discussed potential benefits, risk, and side effects of benzodiazepines to include potential risk of tolerance and dependence, as well as possible drowsiness.  Advised patient not to drive if experiencing drowsiness and to take lowest possible effective dose to minimize risk of dependence and tolerance.   Counseled patient regarding potential benefits, risks, and side effects of Lamictal  to include potential risk of Stevens-Johnson syndrome. Advised patient to stop taking  Lamictal  and contact office immediately if rash develops and to seek urgent medical attention if rash is severe and/or spreading quickly.   Diagnoses and all orders for this visit:  Chronic post-traumatic stress disorder -     divalproex  (DEPAKOTE  ER) 500 MG 24 hr tablet; Take 4 tablets (2,000 mg total) by mouth at bedtime. -     ALPRAZolam  (XANAX ) 1 MG tablet; Take 1 tablet (1 mg total) by mouth 2 (two) times daily as needed for anxiety or sleep. -     lamoTRIgine  (LAMICTAL ) 150 MG tablet; Take 2 tablets (300 mg total) by mouth at bedtime.  Bipolar I disorder, most recent episode (or current) manic (HCC) -  divalproex  (DEPAKOTE  ER) 500 MG 24 hr tablet; Take 4 tablets (2,000 mg total) by mouth at bedtime. -     lamoTRIgine  (LAMICTAL ) 150 MG tablet; Take 2 tablets (300 mg total) by mouth at bedtime.     Please see After Visit Summary for patient specific instructions.  No future appointments.  No orders of the defined types were placed in this encounter.   -------------------------------

## 2023-11-25 ENCOUNTER — Other Ambulatory Visit: Payer: Self-pay | Admitting: Internal Medicine

## 2023-12-14 DIAGNOSIS — B9689 Other specified bacterial agents as the cause of diseases classified elsewhere: Secondary | ICD-10-CM | POA: Diagnosis not present

## 2023-12-14 DIAGNOSIS — J329 Chronic sinusitis, unspecified: Secondary | ICD-10-CM | POA: Diagnosis not present

## 2024-02-18 NOTE — Progress Notes (Signed)
 " Cardiology Office Note:  .   Date:  02/18/2024  ID:  Paul Simmons, DOB [birth year], MRN [id number] PCP: Orpha Yancey LABOR, MD  [facility] Providers Cardiologist:  None {  History of Present Illness: .   Paul Simmons is a 44 y.o. male w/PMHx of  HLD, hypothyroidism, Bipolar d/o, OSA SVT  He last saw Dr. Waddell 04/20/23, described sudden on/off palpitations, provoked diaphoresis, generally ~ duration, a few times a month Discussed treatment options for SVT, including ablation. He preferred to avoid procedure, sontinued on Toprol  to titrate up as needed/tolerated And encouraged CPAP compliance  Today's visit is scheduled as a 9 mo visit ROS:   He comes today with his wife (she is a CHARITY FUNDRAISER) He reports that since his palpitations/SVT started, has had short episode s about 1x/week, usually 5-7 min or so, this is improved since on metoprolol , and has been acceptable to him.  When he called for today's appt was after a day that he had 3 episodes in that day within a couple hours of each other, the last one lasted 22 minutes and made him feel poorly, diaphoretic, very weak, and tired afterwards  There has never been any clear trigger, though sounds like he takes in a fair amount of caffeine has not been felt to trigger his SVT  He otherwise feels quite well He is on disability after a terrible ankle fracture that required extensive surgery So not formal exercise, but is active with no difficulties in ADLs No CP, SOB No near syncope or syncope   Arrhythmia/AAD hx SVT  Studies Reviewed: SABRA    EKG done today and reviewed by myself:  SR 72bpm, no changes from prior   Aug 2024, monitor NSR with sinus brady (48/min) and sinus tachy (138/min) with ave HR 72/min 3 episodes of sustained SVT lasting minutes, associated with symptoms and HR of 197/min Rare PVC's (1%) and PAC's (<1%) No VT or atrial fib No prolonged pauses SVT was associated with palpitations.     09/12/22:  TTE [facility]) Summary   1. The left ventricle is normal in size with normal wall thickness.    2. The left ventricular systolic function is normal, LVEF is visually  estimated at > 55%.    3. The right ventricle is normal in size, with normal systolic function.    Risk Assessment/Calculations:    Physical Exam:   VS:  There were no vitals taken for this visit.   Wt Readings from Last 3 Encounters:  04/20/23 (!) 301 lb (136.5 kg)  01/19/23 292 lb 12.8 oz (132.8 kg)  12/01/22 292 lb (132.5 kg)    GEN: Well nourished, well developed in no acute distress NECK: No JVD; No carotid bruits CARDIAC: RRR, no murmurs, rubs, gallops RESPIRATORY:  CTA b/l without rales, wheezing or rhonchi  ABDOMEN: Soft, non-tender, non-distended EXTREMITIES: No edema; No deformity   ASSESSMENT AND PLAN: .    SVT Recent change in behavior Feels exactly the same as his known SVT , but an upp-tick in frequency/duration  Discussed Dr. Adrian retirement > need for a new EP MD He has heard of Dr. Inocencio, would like to see him. Discussed EPS/ablation, procedure and potential limitations Dr. Waddell had discussed this extensively with him, he is not quite ready to pursue ablation/procedural strategy  Will increase his Toprol  to 50mg  daily Add lopressor  25mg  PO Q6 hours PRN for sustained SVT    Dispo: back in 6-8 weeks he would like  to meet Dr. Inocencio, see how he does on  the increased dose, perhaps consider ablation/alternative medication strategy  Signed, Charlies Macario Arthur, PA-C   "

## 2024-02-19 ENCOUNTER — Ambulatory Visit: Attending: Physician Assistant | Admitting: Physician Assistant

## 2024-02-19 VITALS — BP 124/64 | HR 72 | Ht 72.0 in | Wt 314.0 lb

## 2024-02-19 DIAGNOSIS — I471 Supraventricular tachycardia, unspecified: Secondary | ICD-10-CM | POA: Diagnosis present

## 2024-02-19 MED ORDER — METOPROLOL TARTRATE 25 MG PO TABS: 25.0000 mg | ORAL_TABLET | Freq: Four times a day (QID) | ORAL | 0 refills | Status: DC | PRN

## 2024-02-19 MED ORDER — METOPROLOL SUCCINATE ER 50 MG PO TB24: 50.0000 mg | ORAL_TABLET | Freq: Every day | ORAL | 3 refills | Status: AC

## 2024-02-19 NOTE — Patient Instructions (Signed)
 Medication Instructions:    START TAKING  :  METOPROLOL  50 MG ONCE   A  DAY     START TAKING:   METOPROLOL   25 MG  AS NEEDED EVERY  6 HOURS  FOR  SVT    *If you need a refill on your cardiac medications before your next appointment, please call your pharmacy*   Lab Work: NONE ORDERED  TODAY    If you have labs (blood work) drawn today and your tests are completely normal, you will receive your results only by: MyChart Message (if you have MyChart) OR A paper copy in the mail If you have any lab test that is abnormal or we need to change your treatment, we will call you to review the results.  Testing/Procedures: NONE ORDERED  TODAY    Follow-Up: At [facility], you and your health needs are our priority.  As part of our continuing mission to provide you with exceptional heart care, our providers are all part of one team.  This team includes your primary Cardiologist (physician) and Advanced Practice Providers or APPs (Physician Assistants and Nurse Practitioners) who all work together to provide you with the care you need, when you need it.  Your next appointment:   6 -8 week(s)   Provider:   Soyla Norton, MD  ( CONTACT  CASSIE HALL/ ANGELINE HAMMER FOR EP SCHEDULING ISSUES )   We recommend signing up for the patient portal called MyChart.  Sign up information is provided on this After Visit Summary.  MyChart is used to connect with patients for Virtual Visits (Telemedicine).  Patients are able to view lab/test results, encounter notes, upcoming appointments, etc.  Non-urgent messages can be sent to your provider as well.   To learn more about what you can do with MyChart, go to [email or web address].   Other Instructions

## 2024-02-24 ENCOUNTER — Other Ambulatory Visit: Payer: Self-pay | Admitting: Internal Medicine

## 2024-02-24 NOTE — Telephone Encounter (Signed)
 Pt of Retired Dr. Waddell  In accordance with refill protocols, please review and address the following requirements before this medication refill can be authorized:  Labs

## 2024-02-26 ENCOUNTER — Ambulatory Visit: Admitting: Adult Health

## 2024-02-26 ENCOUNTER — Encounter: Payer: Self-pay | Admitting: Adult Health

## 2024-02-26 DIAGNOSIS — F909 Attention-deficit hyperactivity disorder, unspecified type: Secondary | ICD-10-CM

## 2024-02-26 DIAGNOSIS — F3110 Bipolar disorder, current episode manic without psychotic features, unspecified: Secondary | ICD-10-CM

## 2024-02-26 DIAGNOSIS — F319 Bipolar disorder, unspecified: Secondary | ICD-10-CM

## 2024-02-26 DIAGNOSIS — F411 Generalized anxiety disorder: Secondary | ICD-10-CM

## 2024-02-26 DIAGNOSIS — Z79899 Other long term (current) drug therapy: Secondary | ICD-10-CM | POA: Diagnosis not present

## 2024-02-26 DIAGNOSIS — F4312 Post-traumatic stress disorder, chronic: Secondary | ICD-10-CM

## 2024-02-26 NOTE — Progress Notes (Signed)
 Paul Simmons [id number] 1980-04-18 44 y.o.  Virtual Visit via Telephone Note  I connected with pt on 02/26/24 at  8:30 AM EST by telephone and verified that I am speaking with the correct person using two identifiers.   I discussed the limitations, risks, security and privacy concerns of performing an evaluation and management service by telephone and the availability of in person appointments. I also discussed with the patient that there may be a patient responsible charge related to this service. The patient expressed understanding and agreed to proceed.   I discussed the assessment and treatment plan with the patient. The patient was provided an opportunity to ask questions and all were answered. The patient agreed with the plan and demonstrated an understanding of the instructions.   The patient was advised to call back or seek an in-person evaluation if the symptoms worsen or if the condition fails to improve as anticipated.  I provided 15 minutes of non-face-to-face time during this encounter.  The patient was located at home.  The provider was located at [facility].   Angeline LOISE Sayers, NP   Subjective:   Patient ID:  Paul Simmons is a 44 y.o. (DOB [birth year]) male.  Chief Complaint: No chief complaint on file.   HPI Paul Simmons presents for follow-up of ADHD, GAD, BPD-1 and PTSD.   Describes mood today as ok. Pleasant. Denies tearfulness. Mood symptoms - denies depression, anxiety and irritability. Denies recent panic attacks. Denies feeling overwhelmed. Denies worry, rumination and over thinking. Reports mood is stable. Stating I feel like I'm doing good. Feels like medications work well. Taking medications as prescribed.  Energy levels improved. Active, does not have a regular exercise routine - planning to start.  Enjoys some usual interests and activities. Married. 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 Spending time with family. Appetite adequate.  Weight gain - 291 to 314 pounds. Sleeping well most nights. Averages 8 or more hours. Focus and concentration stable. Completing tasks. Managing aspects of household. Disabled since 2008. Denies SI or HI.  Denies AH or VH. Denies self harm. Denies substance use.  Previous medication trials: Unknown  Review of Systems:  Review of Systems  Musculoskeletal:  Negative for gait problem.  Neurological:  Negative for tremors.  Psychiatric/Behavioral:         Please refer to HPI    Medications: I have reviewed the patient's current medications.  Current Outpatient Medications  Medication Sig Dispense Refill   ALPRAZolam  (XANAX ) 1 MG tablet Take 1 tablet (1 mg total) by mouth 2 (two) times daily as needed for anxiety or sleep. 60 tablet 2   divalproex  (DEPAKOTE  ER) 500 MG 24 hr tablet Take 4 tablets (2,000 mg total) by mouth at bedtime. 120 tablet 2   gabapentin (NEURONTIN) 100 MG capsule TAKE 1 CAPSULE BY MOUTH 3 TIMES A DAY AS NEEDED     lamoTRIgine  (LAMICTAL ) 150 MG tablet Take 2 tablets (300 mg total) by mouth at bedtime. 60 tablet 2   levothyroxine  (SYNTHROID , LEVOTHROID) 50 MCG tablet Take 50 mcg by mouth daily before breakfast.     metoprolol  succinate (TOPROL  XL) 50 MG 24 hr tablet Take 1 tablet (50 mg total) by mouth daily. 90 tablet 3   metoprolol  tartrate (LOPRESSOR ) 25 MG tablet Take 1 tablet (25 mg total) by mouth every 6 (six) hours as needed (Supraventricular Tachycardia). 90 tablet 0   rosuvastatin  (CRESTOR ) 10 MG tablet Take 1 tablet (10 mg total) by mouth daily. 90  tablet 0   No current facility-administered medications for this visit.    Medication Side Effects: None  Allergies: Allergies[1]  Past Medical History:  Diagnosis Date   Bipolar 1 disorder (HCC)    Bipolar disorder (HCC) 05/14/2016   Bipolar I disorder, most recent episode (or current) manic (HCC) 01/22/2018   Chronic cholecystitis with calculus 05/14/2016   Hyperlipidemia    Hypothyroidism    OSA  (obstructive sleep apnea) 05/14/2016   He has a CPAP machine but does not use it.   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  Social History   Socioeconomic History   Marital status: Married    Spouse name: Not on file   Number of children: Not on file   Years of education: Not on file   Highest education level: Not on file  Occupational History   Not on file  Tobacco Use   Smoking status: Never   Smokeless tobacco: Former    Types: Designer, Multimedia Use   Vaping status: Never Used  Substance and Sexual Activity   Alcohol use: Yes    Comment: occassionally   Drug use: No   Sexual activity: Yes  Other Topics Concern   Not on file  Social History Narrative   Not on file   Social Drivers of Health   Tobacco Use: Low Risk (12/14/2023)   Received from [facility]   Patient History    Smoking Tobacco Use: Never    Smokeless Tobacco Use: Never    Passive Exposure: Not on file  Recent Concern: Tobacco Use - Medium Risk (11/24/2023)   Patient History    Smoking Tobacco Use: Never    Smokeless Tobacco Use: Former    Passive Exposure: Not on Actuary Strain: Not on file  Food Insecurity: Not on file  Transportation Needs: Not on file  Physical Activity: Not on file  Stress: Not on file  Social Connections: Unknown (06/25/2021)   Received from [facility]   Social Network    Social Network: Not on file  Intimate Partner Violence: Unknown (05/17/2021)   Received from [facility]   HITS    Physically Hurt: Not on file    Insult or Talk Down To: Not on file    Threaten Physical Harm: Not on file    Scream or Curse: Not on file  Depression (EYV7-0): Not on file  Alcohol Screen: Not on file  Housing: Not on file  Utilities: Not on file  Health Literacy: Not on file    Past Medical History, Surgical history, Social history, and Family history were reviewed and updated as  appropriate.   Please see review of systems for further details on the patient's review from today.   Objective:   Physical Exam:  There were no vitals taken for this visit.  Physical Exam Constitutional:      General: He is not in acute distress. Musculoskeletal:        General: No deformity.  Neurological:     Mental Status: He is alert and oriented to person, place, and time.     Coordination: Coordination normal.  Psychiatric:        Attention and Perception: Attention and perception normal. He does not perceive auditory or visual hallucinations.        Mood and Affect: Mood normal. Mood is not anxious or depressed. Affect is not labile, blunt, angry  or inappropriate.        Speech: Speech normal.        Behavior: Behavior normal.        Thought Content: Thought content normal. Thought content is not paranoid or delusional. Thought content does not include homicidal or suicidal ideation. Thought content does not include homicidal or suicidal plan.        Cognition and Memory: Cognition and memory normal.        Judgment: Judgment normal.     Comments: Insight intact     Lab Review:     Component Value Date/Time   NA 134 (L) 05/23/2018 1842   K 3.5 05/23/2018 1842   CL 99 05/23/2018 1842   CO2 25 05/23/2018 1842   GLUCOSE 92 05/23/2018 1842   BUN 8 05/23/2018 1842   CREATININE 1.36 (H) 05/23/2018 1842   CALCIUM  9.2 05/23/2018 1842   PROT 7.6 01/19/2023 1704   ALBUMIN 4.8 01/19/2023 1704   AST 39 01/19/2023 1704   ALT 53 (H) 01/19/2023 1704   ALKPHOS 75 01/19/2023 1704   BILITOT 0.3 01/19/2023 1704   GFRNONAA >60 05/23/2018 1842   GFRAA >60 05/23/2018 1842       Component Value Date/Time   WBC 5.4 05/23/2018 1842   RBC 5.29 05/23/2018 1842   HGB 15.4 05/23/2018 1842   HCT 45.8 05/23/2018 1842   PLT 182 05/23/2018 1842   MCV 86.6 05/23/2018 1842   MCH 29.1 05/23/2018 1842   MCHC 33.6 05/23/2018 1842   RDW 13.0 05/23/2018 1842   LYMPHSABS 1.7 05/23/2018  1842   MONOABS 0.7 05/23/2018 1842   EOSABS 0.1 05/23/2018 1842   BASOSABS 0.0 05/23/2018 1842    No results found for: POCLITH, LITHIUM   Lab Results  Component Value Date   VALPROATE 57.9 12/29/2007     .res Assessment: Plan:    Plan:  PDMP reviewed  Continue: Lamictal  150mg  - 2 at bedtime. Depakote  ER 500mg  - 4 at bedtime Xanax  1mg  BID  Will mail lab slip for Depakote  level.  RTC 3 months  15 minutes spent dedicated to the care of this patient on the date of this encounter to include pre-visit review of records, ordering of medication, post visit documentation, and face-to-face time with the patient discussing ADHD, GAD, BPD-1 and PTSD. Discussed continuing current medication regimen.  Patient advised to contact office with any questions, adverse effects, or acute worsening in signs and symptoms.  Discussed potential benefits, risk, and side effects of benzodiazepines to include potential risk of tolerance and dependence, as well as possible drowsiness. Advised patient not to drive if experiencing drowsiness and to take lowest possible effective dose to minimize risk of dependence and tolerance.   Counseled patient regarding potential benefits, risks, and side effects of Lamictal  to include potential risk of Stevens-Johnson syndrome. Advised patient to stop taking Lamictal  and contact office immediately if rash develops and to seek urgent medical attention if rash is severe and/or spreading quickly.   There are no diagnoses linked to this encounter.  Please see After Visit Summary for patient specific instructions.  Future Appointments  Date Time Provider Department Center  02/26/2024  8:30 AM Zykee Avakian Nattalie, NP [facility] None  04/08/2024  9:15 AM Camnitz, Soyla Lunger, MD [facility]    No orders of the defined types were placed in this encounter.     -------------------------------    [1] No Known Allergies

## 2024-03-08 ENCOUNTER — Other Ambulatory Visit: Payer: Self-pay | Admitting: Physician Assistant

## 2024-03-15 ENCOUNTER — Other Ambulatory Visit: Payer: Self-pay | Admitting: Adult Health

## 2024-03-15 DIAGNOSIS — F3110 Bipolar disorder, current episode manic without psychotic features, unspecified: Secondary | ICD-10-CM

## 2024-03-15 DIAGNOSIS — F4312 Post-traumatic stress disorder, chronic: Secondary | ICD-10-CM

## 2024-04-08 ENCOUNTER — Ambulatory Visit: Admitting: Cardiology
